# Patient Record
Sex: Female | Born: 1960 | Race: White | Hispanic: No | Marital: Married | Smoking: Never smoker
Health system: Southern US, Community
[De-identification: ages and names within clinical notes are randomized; demographics above are authoritative.]

## PROBLEM LIST (undated history)

## (undated) DIAGNOSIS — G373 Acute transverse myelitis in demyelinating disease of central nervous system: Secondary | ICD-10-CM

## (undated) DIAGNOSIS — C801 Malignant (primary) neoplasm, unspecified: Secondary | ICD-10-CM

## (undated) DIAGNOSIS — N816 Rectocele: Secondary | ICD-10-CM

## (undated) DIAGNOSIS — M1990 Unspecified osteoarthritis, unspecified site: Secondary | ICD-10-CM

## (undated) DIAGNOSIS — Z9889 Other specified postprocedural states: Secondary | ICD-10-CM

## (undated) DIAGNOSIS — R112 Nausea with vomiting, unspecified: Secondary | ICD-10-CM

## (undated) HISTORY — PX: ABDOMINAL HYSTERECTOMY: SHX81

## (undated) HISTORY — PX: TONSILLECTOMY: SUR1361

---

## 1992-04-19 HISTORY — PX: VAGINAL HYSTERECTOMY: SUR661

## 2018-06-10 ENCOUNTER — Emergency Department: Payer: BLUE CROSS/BLUE SHIELD

## 2018-06-10 ENCOUNTER — Emergency Department
Admission: EM | Admit: 2018-06-10 | Discharge: 2018-06-10 | Disposition: A | Discharge status: Home / Self Care | Payer: BLUE CROSS/BLUE SHIELD | Attending: Student in an Organized Health Care Education/Training Program | Admitting: Student in an Organized Health Care Education/Training Program

## 2018-06-10 DIAGNOSIS — M545 Low back pain: Secondary | ICD-10-CM | POA: Diagnosis present

## 2018-06-10 DIAGNOSIS — Z79899 Other long term (current) drug therapy: Secondary | ICD-10-CM | POA: Diagnosis not present

## 2018-06-10 DIAGNOSIS — Z859 Personal history of malignant neoplasm, unspecified: Secondary | ICD-10-CM | POA: Diagnosis not present

## 2018-06-10 DIAGNOSIS — R200 Anesthesia of skin: Secondary | ICD-10-CM | POA: Insufficient documentation

## 2018-06-10 DIAGNOSIS — M5442 Lumbago with sciatica, left side: Secondary | ICD-10-CM | POA: Diagnosis not present

## 2018-06-10 DIAGNOSIS — M549 Dorsalgia, unspecified: Secondary | ICD-10-CM

## 2018-06-10 HISTORY — DX: Malignant (primary) neoplasm, unspecified: C80.1

## 2018-06-10 LAB — URINALYSIS, COMPLETE (UACMP) WITH MICROSCOPIC
Bacteria, UA: NONE SEEN
Bilirubin Urine: NEGATIVE
Glucose, UA: NEGATIVE mg/dL
Hgb urine dipstick: NEGATIVE
Ketones, ur: NEGATIVE mg/dL
Leukocytes,Ua: NEGATIVE
Nitrite: NEGATIVE
Protein, ur: NEGATIVE mg/dL
Specific Gravity, Urine: 1.029 (ref 1.005–1.030)
Squamous Epithelial / HPF: NONE SEEN (ref 0–5)
pH: 7 (ref 5.0–8.0)

## 2018-06-10 LAB — CBC WITH DIFFERENTIAL/PLATELET
Abs Immature Granulocytes: 0.01 10*3/uL (ref 0.00–0.07)
Basophils Absolute: 0 10*3/uL (ref 0.0–0.1)
Basophils Relative: 1 %
EOS ABS: 0.1 10*3/uL (ref 0.0–0.5)
Eosinophils Relative: 1 %
HCT: 38.8 % (ref 36.0–46.0)
Hemoglobin: 13.2 g/dL (ref 12.0–15.0)
Immature Granulocytes: 0 %
Lymphocytes Relative: 50 %
Lymphs Abs: 1.7 10*3/uL (ref 0.7–4.0)
MCH: 30.7 pg (ref 26.0–34.0)
MCHC: 34 g/dL (ref 30.0–36.0)
MCV: 90.2 fL (ref 80.0–100.0)
Monocytes Absolute: 0.4 10*3/uL (ref 0.1–1.0)
Monocytes Relative: 11 %
Neutro Abs: 1.3 10*3/uL — ABNORMAL LOW (ref 1.7–7.7)
Neutrophils Relative %: 37 %
Platelets: 263 10*3/uL (ref 150–400)
RBC: 4.3 MIL/uL (ref 3.87–5.11)
RDW: 13.1 % (ref 11.5–15.5)
WBC: 3.5 10*3/uL — ABNORMAL LOW (ref 4.0–10.5)
nRBC: 0 % (ref 0.0–0.2)

## 2018-06-10 LAB — LIPASE, BLOOD: Lipase: 28 U/L (ref 11–51)

## 2018-06-10 LAB — COMPREHENSIVE METABOLIC PANEL
ALK PHOS: 98 U/L (ref 38–126)
ALT: 23 U/L (ref 0–44)
AST: 28 U/L (ref 15–41)
Albumin: 4 g/dL (ref 3.5–5.0)
Anion gap: 10 (ref 5–15)
BUN: 19 mg/dL (ref 6–20)
CALCIUM: 10.2 mg/dL (ref 8.9–10.3)
CO2: 22 mmol/L (ref 22–32)
Chloride: 107 mmol/L (ref 98–111)
Creatinine, Ser: 0.77 mg/dL (ref 0.44–1.00)
GFR calc Af Amer: >60 mL/min (ref >60)
GFR calc non Af Amer: >60 mL/min (ref >60)
Glucose, Bld: 88 mg/dL (ref 70–99)
Potassium: 3.6 mmol/L (ref 3.5–5.1)
Sodium: 139 mmol/L (ref 135–145)
TOTAL PROTEIN: 6.7 g/dL (ref 6.5–8.1)
Total Bilirubin: 0.5 mg/dL (ref 0.3–1.2)

## 2018-06-10 MED ORDER — ONDANSETRON HCL 4 MG/2ML IJ SOLN
4.0000 mg | Freq: Once | INTRAMUSCULAR | Status: AC
  Administered 2018-06-10: 4 mg via INTRAVENOUS

## 2018-06-10 MED ORDER — MORPHINE SULFATE (PF) 2 MG/ML IV SOLN
2.0000 mg | Freq: Once | INTRAVENOUS | Status: AC
  Administered 2018-06-10: 2 mg via INTRAVENOUS

## 2018-06-10 MED ORDER — HYDROCODONE-ACETAMINOPHEN 5-325 MG PO TABS
1.0000 | ORAL_TABLET | Freq: Once | ORAL | Status: AC
  Administered 2018-06-10: 1 via ORAL
  Filled 2018-06-10: qty 1

## 2018-06-10 MED ORDER — HYDROCODONE-ACETAMINOPHEN 5-325 MG PO TABS: 1.0000 | ORAL_TABLET | ORAL | 0 refills | Status: AC | PRN

## 2018-06-10 MED ORDER — PREDNISONE 10 MG PO TABS: 10.0000 mg | ORAL_TABLET | Freq: Every day | ORAL | 0 refills | Status: DC

## 2018-06-10 MED ORDER — HYDROCODONE-ACETAMINOPHEN 5-325 MG PO TABS: 1.0000 | ORAL_TABLET | ORAL | 0 refills | Status: DC | PRN

## 2018-06-10 MED ORDER — KETOROLAC TROMETHAMINE 30 MG/ML IJ SOLN
30.0000 mg | Freq: Once | INTRAMUSCULAR | Status: AC
  Administered 2018-06-10: 30 mg via INTRAVENOUS

## 2018-06-10 MED ORDER — PREDNISONE 20 MG PO TABS
60.0000 mg | ORAL_TABLET | Freq: Once | ORAL | Status: AC
  Administered 2018-06-10: 60 mg via ORAL
  Filled 2018-06-10: qty 3

## 2018-06-10 MED ORDER — KETOROLAC TROMETHAMINE 30 MG/ML IJ SOLN
INTRAMUSCULAR | Status: AC
  Administered 2018-06-10: 30 mg via INTRAVENOUS
  Filled 2018-06-10: qty 1

## 2018-06-10 MED ORDER — MORPHINE SULFATE (PF) 2 MG/ML IV SOLN
INTRAVENOUS | Status: AC
  Administered 2018-06-10: 2 mg via INTRAVENOUS
  Filled 2018-06-10: qty 1

## 2018-06-10 MED ORDER — LIDOCAINE 5 % EX PTCH
1.0000 | MEDICATED_PATCH | CUTANEOUS | Status: DC
  Administered 2018-06-10: 1 via TRANSDERMAL

## 2018-06-10 MED ORDER — IOPAMIDOL (ISOVUE-300) INJECTION 61%: 100.0000 mL | Freq: Once | INTRAVENOUS | Status: DC | PRN

## 2018-06-10 MED ORDER — IOHEXOL 300 MG/ML  SOLN
100.0000 mL | Freq: Once | INTRAMUSCULAR | Status: AC | PRN
  Administered 2018-06-10: 100 mL via INTRAVENOUS

## 2018-06-10 MED ORDER — ONDANSETRON HCL 4 MG/2ML IJ SOLN
INTRAMUSCULAR | Status: AC
  Administered 2018-06-10: 4 mg via INTRAVENOUS
  Filled 2018-06-10: qty 2

## 2018-06-10 NOTE — ED Notes (Signed)
Pt returned from CT.  States pain is better at this time, but left leg feels numb and she feels as if she can't lift it from the bed.  Pt voices no other needs or c/o at this time.  Awaiting Ct report, will continue to monitor.

## 2018-06-10 NOTE — ED Triage Notes (Signed)
Patient coming [facility] from for lower back pain radiating down left leg and syncopal episode (due to pain per patient. Patient reportedly got a new small trampoline 2 days ago.  EMS vitals: 128/72, 100% on RA, HR 73, pain 10 of 10.

## 2018-06-10 NOTE — ED Notes (Signed)
Pt walked with this RN using walker. At first, pt struggled some because L foot dragged behind her as though it were asleep. Pt continued to c/o numbness. Once pt got to room door (5 steps), pt gained full control/strength of L leg and stated that her numbness dec. Walker maintained pt's steadiness. Pt walked total of 25 feet with walker.

## 2018-06-10 NOTE — ED Provider Notes (Signed)
Patient received in signout from Dr. Owens Shark pending CT imaging.  Patient did have evidence of distribution and given her tingling weakness MRI was ordered to rule out more insidious pathology.  MRI does show evidence of radiculopathy.  No focal deficits.  Patient suffering from sciatica type symptoms.  Able to ambulate with walker assistance.  Physical therapy evaluated patient.  Will start on prednisone taper as well as pain medication.  Will give referral to neurosurgery but otherwise I believe the patient stable and appropriate for outpatient management.   Bethany Lot, MD 06/10/18 1258

## 2018-06-10 NOTE — ED Provider Notes (Addendum)
[facility] Emergency Department Provider Note ____________________   First MD Initiated Contact with Patient 06/10/18 [phone]     (approximate)  I have reviewed the triage vital signs and the nursing notes.   HISTORY  Chief Complaint Back Pain and Loss of Consciousness    HPI Bethany Farmer is a 58 y.o. femalepresents emergency department via EMS with 10 out of 10 low back pain with radiation down the posterior left leg with onset prior to arrival.  Patient states that she got up from bed to go to the bathroom pain began abruptly.  Patient states that she had a syncopal episode which she states was as a result of her pain.  Patient states that she has had previous syncopal episode secondary to pain.   Past Medical History:  Diagnosis Date  . Cancer ([place])     There are no active problems to display for this patient.   Past Surgical History:  Procedure Laterality Date  . ABDOMINAL HYSTERECTOMY    . TONSILLECTOMY      Prior to Admission medications   Medication Sig Start Date End Date Taking? Authorizing Provider  Abaloparatide (TYMLOS [place]) Inject into the skin.   Yes [provider]  traMADol (ULTRAM) 50 MG tablet Take 100 mg by mouth 3 (three) times daily.   Yes [provider]    Allergies Patient has no known allergies.  No family history on file.  Social History Social History   Tobacco Use  . Smoking status: Never Smoker  . Smokeless tobacco: Never Used  Substance Use Topics  . Alcohol use: Not Currently  . Drug use: Not on file    Review of Systems Constitutional: No fever/chills Eyes: No visual changes. ENT: No sore throat. Cardiovascular: Denies chest pain. Respiratory: Denies shortness of breath. Gastrointestinal: No abdominal pain.  No nausea, no vomiting.  No diarrhea.  No constipation. Genitourinary: Negative for dysuria. Musculoskeletal: Negative for neck pain.  Positive for back pain. Integumentary:  Negative for rash. Neurological: Negative for headaches, focal weakness or numbness.  ____________________________________________   PHYSICAL EXAM:  VITAL SIGNS: ED Triage Vitals  Enc Vitals Group     BP 06/10/18 0607 124/76     Pulse Rate 06/10/18 0607 78     Resp 06/10/18 0607 (!) 25     Temp 06/10/18 0607 (!) 96.4 F (35.8 C)     Temp Source 06/10/18 0607 Oral     SpO2 06/10/18 0607 100 %     Weight 06/10/18 0604 58.5 kg (129 lb)     Height 06/10/18 0604 1.676 m (5\' 6" )     Head Circumference --      Peak Flow --      Pain Score 06/10/18 0604 10     Pain Loc --      Pain Edu? --      Excl. in [place]? --     Constitutional: Alert and oriented.  Apparent discomfort  eyes: Conjunctivae are normal. PERRL. EOMI. Head: Atraumatic. Ears:  Healthy appearing ear canals and TMs bilaterally Nose: No congestion/rhinnorhea. Mouth/Throat: Mucous membranes are moist. Oropharynx non-erythematous. Neck: No stridor.   Cardiovascular: Normal rate, regular rhythm. Good peripheral circulation. Grossly normal heart sounds. Respiratory: Normal respiratory effort.  No retractions. Lungs CTAB. Gastrointestinal: Soft and nontender. No distention.  Musculoskeletal: No lower extremity tenderness nor edema. No gross deformities of extremities. Neurologic:  Normal speech and language. No gross focal neurologic deficits are appreciated.  Skin:  Skin is warm, dry  and intact. No rash noted. Psychiatric: Mood and affect are normal. Speech and behavior are normal.  ____________________________________________   LABS (all labs ordered are listed, but only abnormal results are displayed)  Labs Reviewed  CBC WITH DIFFERENTIAL/PLATELET - Abnormal; Notable for the following components:      Result Value   WBC 3.5 (*)    Neutro Abs 1.3 (*)    All other components within normal limits  COMPREHENSIVE METABOLIC PANEL  LIPASE, BLOOD  URINALYSIS, COMPLETE (UACMP) WITH MICROSCOPIC   ED ECG REPORT I,  Waipio N Draven Laine, the attending physician, personally viewed and interpreted this ECG.   Date: 06/10/2018  EKG Time: 6:12 AM  Rate: 75  Rhythm: Normal sinus rhythm  Axis: Normal  Intervals: Normal  ST&T Change: None     Procedures   ____________________________________________   INITIAL IMPRESSION / ASSESSMENT AND PLAN / ED COURSE  As part of my medical decision making, I reviewed the following data within the electronic MEDICAL RECORD NUMBER   58 year old female presented with above-stated history and physical exam with differential diagnosis including sciatica, aortic injury.  As such CT scan of the abdomen pelvis with reconstruction of the lumbar spine ordered.  Patient given IV morphine 2 mg and Zofran 4 mg immediately after my evaluation with improvement of pain.  Patient's current pain score 6 out of 10 at such patient given Toradol 30 mg.  CT scan pending at this time anticipate possible discharge when pain control is achieved if patient scan consistent with sciatica.  Patient's care transferred to Dr. Quentin Cornwall    ____________________________________________  FINAL CLINICAL IMPRESSION(S) / ED DIAGNOSES  Final diagnoses:  Back pain     MEDICATIONS GIVEN DURING THIS VISIT:  Medications  lidocaine (LIDODERM) 5 % 1 patch (1 patch Transdermal Patch Applied 06/10/18 0617)  morphine 2 MG/ML injection 2 mg (2 mg Intravenous Given 06/10/18 0617)  ondansetron (ZOFRAN) injection 4 mg (4 mg Intravenous Given 06/10/18 0617)  ketorolac (TORADOL) 30 MG/ML injection 30 mg (30 mg Intravenous Given 06/10/18 0644)  iohexol (OMNIPAQUE) 300 MG/ML solution 100 mL (100 mLs Intravenous Contrast Given 06/10/18 2694)     ED Discharge Orders    None       Note:  This document was prepared using Dragon voice recognition software and may include unintentional dictation errors.   Gregor Hams, MD 06/10/18 8546    Gregor Hams, MD 06/24/18 [phone]

## 2018-06-10 NOTE — Evaluation (Signed)
Physical Therapy Evaluation Patient Details Name: Paulita Licklider MRN: [id number] DOB: [birth year] Today's Date: 06/10/2018   History of Present Illness  Pt is a 58 year old female who comes to the ED with report of L side LBP and numbness of L LE.  Only noted hx is CA.  Clinical Impression  Pt is a 58 year old female who lives in a one story home with her husband and is a Hydrographic surveyor at baseline.  Pt in bed and reporting low back pain but able to perform bed mobility mod I.  Pt + for posterolateral trunk extension directional preference as well as supine figure 4 stretch.  She presented with fair to good strength of UE/LE.  PT open to all education.  Pt able to STS and ambulate with min VC's for use of RW.  Pt will benefit from skilled PT with focus on pain management, HEP and safe functional mobility.  She is appropriate for OP PT at this time.    Follow Up Recommendations Outpatient PT    Equipment Recommendations  Rolling walker with 5" wheels    Recommendations for Other Services       Precautions / Restrictions Restrictions Weight Bearing Restrictions: No      Mobility  Bed Mobility Overal bed mobility: Modified Independent             General bed mobility comments: slow to move  Transfers Overall transfer level: Modified independent Equipment used: Rolling walker (2 wheeled)             General transfer comment: Pt able to stand from bedside without UE assist but more steady with RW.  Ambulation/Gait Ambulation/Gait assistance: Supervision Gait Distance (Feet): 25 Feet Assistive device: Standard walker     Gait velocity interpretation: 1.31 - 2.62 ft/sec, indicative of limited community ambulator General Gait Details: Fair foot clearance with slightly flexed posture due to pain.  Stairs            Wheelchair Mobility    Modified Rankin (Stroke Patients Only)       Balance Overall balance assessment: Needs assistance   Sitting  balance-Leahy Scale: Normal     Standing balance support: Bilateral upper extremity supported Standing balance-Leahy Scale: Fair Standing balance comment: Needs RW for support                             Pertinent Vitals/Pain Pain Assessment: 0-10 Faces Pain Scale: Hurts a little bit Pain Location: L low back Pain Descriptors / Indicators: Aching Pain Intervention(s): Monitored during session    Home Living Family/patient expects to be discharged to:: Private residence Living Arrangements: Spouse/significant other Available Help at Discharge: Family;Available PRN/intermittently Type of Home: House Home Access: Stairs to enter Entrance Stairs-Rails: Can reach both Entrance Stairs-Number of Steps: 3 Home Layout: One level Home Equipment: None      Prior Function Level of Independence: Independent         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     Hand Dominance        Extremity/Trunk Assessment   Upper Extremity Assessment Upper Extremity Assessment: Overall WFL for tasks assessed    Lower Extremity Assessment Lower Extremity Assessment: Overall WFL for tasks assessed    Cervical / Trunk Assessment Cervical / Trunk Assessment: Normal  Communication   Communication: No difficulties  Cognition Arousal/Alertness: Awake/alert Behavior During Therapy: WFL for tasks assessed/performed Overall Cognitive Status: Within Functional  Limits for tasks assessed                                        General Comments      Exercises Other Exercises Other Exercises: Assessment for directional preference with pt favoring trunk extension: x10 with decrease in LBP but not in L LE numbness. Other Exercises: Education regarding HEP management until pt can get started with OP PT. Other Exercises: Figure 4 testing: + for L hip pain but no change in L LE numbness. Other Exercises: Education for use of RW and adjustment of RW  to pt height.   Assessment/Plan    PT Assessment Patient needs continued PT services  PT Problem List Decreased strength;Decreased balance;Decreased knowledge of use of DME;Decreased activity tolerance;Pain       PT Treatment Interventions DME instruction;Therapeutic exercise;Gait training;Stair training;Balance training;Functional mobility training;Therapeutic activities;Patient/family education;Neuromuscular re-education    PT Goals (Current goals can be found in the Care Plan section)  Acute Rehab PT Goals Patient Stated Goal: to return to work and to be able to move pain free PT Goal Formulation: With patient Time For Goal Achievement: 06/24/18 Potential to Achieve Goals: Good    Frequency Min 2X/week   Barriers to discharge        Co-evaluation               AM-PAC PT "6 Clicks" Mobility  Outcome Measure Help needed turning from your back to your side while in a flat bed without using bedrails?: None Help needed moving from lying on your back to sitting on the side of a flat bed without using bedrails?: A Little Help needed moving to and from a bed to a chair (including a wheelchair)?: A Little Help needed standing up from a chair using your arms (e.g., wheelchair or bedside chair)?: A Little Help needed to walk in hospital room?: A Little Help needed climbing 3-5 steps with a railing? : A Little 6 Click Score: 19    End of Session   Activity Tolerance: Patient tolerated treatment well Patient left: in bed;with family/visitor present Nurse Communication: Mobility status PT Visit Diagnosis: Other abnormalities of gait and mobility (R26.89);Muscle weakness (generalized) (M62.81);Pain Pain - Right/Left: (Back)    Time: 1937-9024 PT Time Calculation (min) (ACUTE ONLY): 22 min   Charges:   PT Evaluation $PT Eval Low Complexity: 1 Low PT Treatments $Therapeutic Activity: 8-22 mins        Roxanne Gates, PT, DPT   Roxanne Gates 06/10/2018, 12:55 PM

## 2018-06-10 NOTE — ED Notes (Signed)
Patient transported to MRI 

## 2018-06-10 NOTE — Discharge Instructions (Addendum)

## 2018-06-10 NOTE — ED Notes (Signed)
Pt's L dorsalis pedis pulse 2+. Foot appropriate color and warmth.

## 2018-06-14 DIAGNOSIS — R200 Anesthesia of skin: Secondary | ICD-10-CM | POA: Insufficient documentation

## 2018-06-14 DIAGNOSIS — M810 Age-related osteoporosis without current pathological fracture: Secondary | ICD-10-CM | POA: Insufficient documentation

## 2019-06-21 ENCOUNTER — Encounter: Payer: Self-pay | Admitting: Family Medicine

## 2019-07-11 ENCOUNTER — Other Ambulatory Visit: Payer: Self-pay | Admitting: Neurology

## 2019-07-11 DIAGNOSIS — G373 Acute transverse myelitis in demyelinating disease of central nervous system: Secondary | ICD-10-CM

## 2019-07-25 ENCOUNTER — Ambulatory Visit: Payer: BC Managed Care – PPO

## 2019-08-01 ENCOUNTER — Other Ambulatory Visit: Payer: Self-pay

## 2019-08-01 ENCOUNTER — Encounter: Payer: Self-pay | Admitting: Gastroenterology

## 2019-08-01 ENCOUNTER — Ambulatory Visit (INDEPENDENT_AMBULATORY_CARE_PROVIDER_SITE_OTHER): Payer: BC Managed Care – PPO | Admitting: Gastroenterology

## 2019-08-01 VITALS — BP 120/70 | HR 69 | Temp 97.5°F | Ht 66.0 in | Wt 144.6 lb

## 2019-08-01 DIAGNOSIS — R748 Abnormal levels of other serum enzymes: Secondary | ICD-10-CM | POA: Diagnosis not present

## 2019-08-01 DIAGNOSIS — Z86010 Personal history of colonic polyps: Secondary | ICD-10-CM

## 2019-08-01 NOTE — Progress Notes (Signed)
Gastroenterology Consultation  Referring Provider:     Nadean Corwin, MD Primary Care Physician:  Darius Bump, FNP Primary Gastroenterologist:  Dr. Allen Norris     Reason for Consultation:     Abnormal liver enzymes        HPI:   Bethany Farmer is a 59 y.o. y/o female referred for consultation & management of abnormal liver enzymes by Dr. Rock Nephew, Bonnell Public, FNP.  This patient comes in today with a referral sent for evaluation of abnormal liver enzymes with a normal ultrasound.  The patient's most recent labs from my chart show her liver enzymes on February 25 of last year to be normal:  AST (Aspartate Aminotransferase) 23 15 - 41 U/L  ALT (Alanine Aminotransferase) 23 14 - 54 U/L  Bilirubin, Total 0.5 0.4 - 1.5 mg/dL  Alk Phos (Alkaline Phosphatase) 100 24 - 110 U/L   The patient had an ultrasound that was done on February 18 of this year that did not show any abnormalities.  She had labs done ore recently but they were not sent over from her previous care provider. The patient had the labs on2/11/2019 her phone and showed AST 26, ALT of 22 and Alk phos of 125 with normal bilirubin.   The patient had a colonoscopy with adenomatous polyps in 2014 and says she is due for another one.  She has only one reading of abnormal liver enzymes with the isolated alkaline phosphatase increase.  The patient also reports that she is having some abdominal bloating and gas.  Past Medical History:  Diagnosis Date  . Cancer [facility])     Past Surgical History:  Procedure Laterality Date  . ABDOMINAL HYSTERECTOMY    . TONSILLECTOMY      Prior to Admission medications   Medication Sig Start Date End Date Taking? Authorizing Provider  Abaloparatide (TYMLOS [place]) Inject into the skin.    [provider]  aspirin 81 MG EC tablet Take by mouth.    [provider]  baclofen (LIORESAL) 10 MG tablet TAKE 1 2 (ONE HALF) TABLET BY MOUTH THREE TIMES DAILY 07/04/19   [provider]   DULoxetine (CYMBALTA) 20 MG capsule Take 20 mg by mouth 2 (two) times daily. 02/15/19   [provider]  gabapentin (NEURONTIN) 600 MG tablet Take 600 mg by mouth 3 (three) times daily. 07/15/19   [provider]  predniSONE (DELTASONE) 10 MG tablet Take 1 tablet (10 mg total) by mouth daily. Day 1-2: Take 50 mg  Day 3-4  Take 40 mg Day 5-6:Take 30 mg Day 7-8: Take 20 mg Day 9: Take 72m 06/10/18   RMerlyn Lot MD  traMADol (ULTRAM) 50 MG tablet Take 100 mg by mouth 3 (three) times daily.    [provider]  valACYclovir (VALTREX) 500 MG tablet Take 500 mg by mouth 2 (two) times daily. 07/16/19   [provider]    No family history on file.   Social History   Tobacco Use  . Smoking status: Never Smoker  . Smokeless tobacco: Never Used  Substance Use Topics  . Alcohol use: Not Currently  . Drug use: Not on file    Allergies as of 08/01/2019  . (No Known Allergies)    Review of Systems:    All systems reviewed and negative except where noted in HPI.   Physical Exam:  There were no vitals taken for this visit. No LMP recorded. Patient has had a hysterectomy. General:  Alert,  Well-developed, well-nourished, pleasant and cooperative in NAD Head:  Normocephalic and atraumatic. Eyes:  Sclera clear, no icterus.   Conjunctiva pink. Ears:  Normal auditory acuity. Neck:  Supple; no masses or thyromegaly. Lungs:  Respirations even and unlabored.  Clear throughout to auscultation.   No wheezes, crackles, or rhonchi. No acute distress. Heart:  Regular rate and rhythm; no murmurs, clicks, rubs, or gallops. Abdomen:  Normal bowel sounds.  No bruits.  Soft, non-tender and non-distended without masses, hepatosplenomegaly or hernias noted.  No guarding or rebound tenderness.  Negative Carnett sign.   Rectal:  Deferred.  Pulses:  Normal pulses noted. Extremities:  No clubbing or edema.  No cyanosis. Neurologic:  Alert and oriented x3;  grossly normal  neurologically. Skin:  Intact without significant lesions or rashes.  No jaundice. Lymph Nodes:  No significant cervical adenopathy. Psych:  Alert and cooperative. Normal mood and affect.  Imaging Studies: No results found.  Assessment and Plan:   Bethany Farmer is a 59 y.o. y/o female who comes in today with a history of adenomatous polyps and in need of a repeat colonoscopy.  The patient's last colonoscopy was in 2014.  The patient alkaline phosphatase was elevated at 125 with the upper limit of normal being 110.  This was a single reading and the patient will have her alkaline phosphatase fractionated and she will also have a GGT sent off.  The algorithm for her work-up is below.  The patient has been reassured that her other liver enzymes are normal and the elevation in GGT may be from a multitude of different causes including medications and may not even be from a liver source at all.  The patient has been explained the plan and agrees with it and she will be contacted with the results of her test.      Lucilla Lame, MD. Marval Regal    Note: This dictation was prepared with Dragon dictation along with smaller phrase technology. Any transcriptional errors that result from this process are unintentional.

## 2019-08-06 ENCOUNTER — Ambulatory Visit: Payer: BC Managed Care – PPO

## 2019-08-06 LAB — ALKALINE PHOSPHATASE, ISOENZYMES
Alkaline Phosphatase: 121 IU/L — ABNORMAL HIGH (ref 39–117)
BONE FRACTION: 16 % (ref 14–68)
INTESTINAL FRAC.: 6 % (ref 0–18)
LIVER FRACTION: 78 % (ref 18–85)

## 2019-08-06 LAB — GAMMA GT: GGT: 32 IU/L (ref 0–60)

## 2019-08-08 ENCOUNTER — Telehealth: Payer: Self-pay

## 2019-08-08 NOTE — Telephone Encounter (Signed)
Pt notified of results

## 2019-08-08 NOTE — Telephone Encounter (Signed)
-----   Message from Lucilla Lame, MD sent at 08/07/2019  7:55 AM EDT ----- Let the patient know that her increased alkaline phosphatase was primarily from the liver but is only minimally elevated and the recommendations are to repeat it in 6 months.

## 2019-08-15 ENCOUNTER — Ambulatory Visit: Payer: BC Managed Care – PPO

## 2019-08-24 ENCOUNTER — Encounter: Payer: Self-pay | Admitting: Gastroenterology

## 2019-08-30 ENCOUNTER — Other Ambulatory Visit
Admission: RE | Admit: 2019-08-30 | Discharge: 2019-08-30 | Disposition: A | Discharge status: Home / Self Care | Payer: BC Managed Care – PPO | Source: Ambulatory Visit | Attending: Gastroenterology | Admitting: Gastroenterology

## 2019-08-30 ENCOUNTER — Other Ambulatory Visit: Payer: Self-pay

## 2019-08-30 DIAGNOSIS — Z20822 Contact with and (suspected) exposure to covid-19: Secondary | ICD-10-CM | POA: Diagnosis not present

## 2019-08-30 DIAGNOSIS — Z01812 Encounter for preprocedural laboratory examination: Secondary | ICD-10-CM | POA: Diagnosis present

## 2019-08-30 LAB — SARS CORONAVIRUS 2 (TAT 6-24 HRS): SARS Coronavirus 2: NEGATIVE

## 2019-09-03 ENCOUNTER — Ambulatory Visit: Payer: BC Managed Care – PPO | Admitting: Anesthesiology

## 2019-09-03 ENCOUNTER — Encounter
Admission: RE | Disposition: A | Discharge status: Home / Self Care | Payer: Self-pay | Source: Home / Self Care | Attending: Gastroenterology

## 2019-09-03 ENCOUNTER — Other Ambulatory Visit: Payer: Self-pay

## 2019-09-03 ENCOUNTER — Ambulatory Visit
Admission: RE | Admit: 2019-09-03 | Discharge: 2019-09-03 | Disposition: A | Discharge status: Home / Self Care | Payer: BC Managed Care – PPO | Attending: Gastroenterology | Admitting: Gastroenterology

## 2019-09-03 ENCOUNTER — Encounter: Payer: Self-pay | Admitting: Gastroenterology

## 2019-09-03 DIAGNOSIS — K640 First degree hemorrhoids: Secondary | ICD-10-CM | POA: Insufficient documentation

## 2019-09-03 DIAGNOSIS — K635 Polyp of colon: Secondary | ICD-10-CM | POA: Diagnosis not present

## 2019-09-03 DIAGNOSIS — Z860100 Personal history of colon polyps, unspecified: Secondary | ICD-10-CM

## 2019-09-03 DIAGNOSIS — Z7982 Long term (current) use of aspirin: Secondary | ICD-10-CM | POA: Insufficient documentation

## 2019-09-03 DIAGNOSIS — D123 Benign neoplasm of transverse colon: Secondary | ICD-10-CM | POA: Diagnosis not present

## 2019-09-03 DIAGNOSIS — M1990 Unspecified osteoarthritis, unspecified site: Secondary | ICD-10-CM | POA: Diagnosis not present

## 2019-09-03 DIAGNOSIS — Z79899 Other long term (current) drug therapy: Secondary | ICD-10-CM | POA: Insufficient documentation

## 2019-09-03 DIAGNOSIS — K5730 Diverticulosis of large intestine without perforation or abscess without bleeding: Secondary | ICD-10-CM | POA: Diagnosis not present

## 2019-09-03 DIAGNOSIS — Z1211 Encounter for screening for malignant neoplasm of colon: Secondary | ICD-10-CM | POA: Diagnosis not present

## 2019-09-03 HISTORY — PX: POLYPECTOMY: SHX5525

## 2019-09-03 HISTORY — DX: Unspecified osteoarthritis, unspecified site: M19.90

## 2019-09-03 HISTORY — DX: Acute transverse myelitis in demyelinating disease of central nervous system: G37.3

## 2019-09-03 HISTORY — PX: COLONOSCOPY WITH PROPOFOL: SHX5780

## 2019-09-03 SURGERY — COLONOSCOPY WITH PROPOFOL
Anesthesia: General | Site: Rectum

## 2019-09-03 MED ORDER — LACTATED RINGERS IV SOLN: INTRAVENOUS | Status: DC

## 2019-09-03 MED ORDER — STERILE WATER FOR IRRIGATION IR SOLN
Status: DC | PRN
  Administered 2019-09-03: 50 mL

## 2019-09-03 MED ORDER — LIDOCAINE HCL (CARDIAC) PF 100 MG/5ML IV SOSY
PREFILLED_SYRINGE | INTRAVENOUS | Status: DC | PRN
  Administered 2019-09-03: 30 mg via INTRAVENOUS

## 2019-09-03 MED ORDER — PROPOFOL 10 MG/ML IV BOLUS
INTRAVENOUS | Status: DC | PRN
  Administered 2019-09-03: 150 mg via INTRAVENOUS
  Administered 2019-09-03: 30 mg via INTRAVENOUS
  Administered 2019-09-03: 40 mg via INTRAVENOUS
  Administered 2019-09-03 (×2): 30 mg via INTRAVENOUS

## 2019-09-03 SURGICAL SUPPLY — 6 items
FORCEPS BIOP RAD 4 LRG CAP 4 (CUTTING FORCEPS) ×3 IMPLANT
GOWN CVR UNV OPN BCK APRN NK (MISCELLANEOUS) ×2 IMPLANT
GOWN ISOL THUMB LOOP REG UNIV (MISCELLANEOUS) ×4
KIT ENDO PROCEDURE OLY (KITS) ×3 IMPLANT
MANIFOLD NEPTUNE II (INSTRUMENTS) ×3 IMPLANT
WATER STERILE IRR 250ML POUR (IV SOLUTION) ×3 IMPLANT

## 2019-09-03 NOTE — H&P (Signed)
Bethany Lame, MD [facility] [address], [place], [place] [address] [phone] Fax : [phone]  Primary Care Physician:  Bethany Bump, FNP Primary Gastroenterologist:  Dr. Allen Farmer  Pre-Procedure History & Physical: HPI:  Bethany Farmer is a 59 y.o. female is here for an colonoscopy.   Past Medical History:  Diagnosis Date  . Arthritis    hands  . Cancer ([place])   . 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 ABDOMINAL HYSTERECTOMY    . TONSILLECTOMY      Prior to Admission medications   Medication Sig Start Date End Date Taking? Authorizing Provider  calcium carbonate (OS-CAL) 1250 (500 Ca) MG chewable tablet Chew 1 tablet by mouth daily.   Yes [provider]  cholecalciferol (VITAMIN D3) 25 MCG (1000 UNIT) tablet Take 1,000 Units by mouth daily.   Yes [provider]  denosumab (PROLIA) 60 MG/ML SOSY injection Inject 60 mg into the skin every 6 (six) months.   Yes [provider]  Javier Docker Oil 1000 MG CAPS Take by mouth daily.   Yes [provider]  Red Yeast Rice 600 MG CAPS Take by mouth daily.   Yes [provider]  Abaloparatide (TYMLOS [place]) Inject into the skin.    [provider]  aspirin 81 MG EC tablet Take by mouth.    [provider]  baclofen (LIORESAL) 10 MG tablet TAKE 1 2 (ONE HALF) TABLET BY MOUTH THREE TIMES DAILY 07/04/19   [provider]  DULoxetine (CYMBALTA) 20 MG capsule Take 20 mg by mouth 2 (two) times daily. 02/15/19   [provider]  gabapentin (NEURONTIN) 600 MG tablet Take 600 mg by mouth 3 (three) times daily. 07/15/19   [provider]  predniSONE (DELTASONE) 10 MG tablet Take 1 tablet (10 mg total) by mouth daily. Day 1-2: Take 50 mg  Day 3-4  Take 40 mg Day 5-6:Take 30 mg Day 7-8: Take 20 mg Day 9: Take 10mg  Patient not taking: Reported on 08/01/2019 06/10/18   Bethany Lot, MD   traMADol (ULTRAM) 50 MG tablet Take 100 mg by mouth 3 (three) times daily.    [provider]  valACYclovir (VALTREX) 500 MG tablet Take 500 mg by mouth 2 (two) times daily. 07/16/19   [provider]    Allergies as of 08/01/2019  . (No Known Allergies)    History reviewed. No pertinent family history.  Social History   Socioeconomic History  . Marital status: Married    Spouse name: Not on file  . Number of children: Not on file  . Years of education: Not on file  . Highest education level: Not on file  Occupational History  . Not on file  Tobacco Use  . Smoking status: Never Smoker  . Smokeless tobacco: Never Used  Substance and Sexual Activity  . Alcohol use: Not Currently  . Drug use: Not on file  . Sexual activity: Not on file  Other Topics Concern  . Not on file  Social History Narrative  . Not on file   Social Determinants of Health   Financial Resource Strain:   . Difficulty of Paying Living Expenses:   Food Insecurity:   . Worried About Charity fundraiser in the Last Year:   . Arboriculturist in the Last Year:   Transportation Needs:   . Film/video editor (Medical):   Marland Kitchen  Lack of Transportation (Non-Medical):   Physical Activity:   . Days of Exercise per Week:   . Minutes of Exercise per Session:   Stress:   . Feeling of Stress :   Social Connections:   . Frequency of Communication with Friends and Family:   . Frequency of Social Gatherings with Friends and Family:   . Attends Religious Services:   . Active Member of Clubs or Organizations:   . Attends Archivist Meetings:   Marland Kitchen Marital Status:   Intimate Partner Violence:   . Fear of Current or Ex-Partner:   . Emotionally Abused:   Marland Kitchen Physically Abused:   . Sexually Abused:     Review of Systems: See HPI, otherwise negative ROS  Physical Exam: Ht 5\' 6"  (1.676 m)   Wt 62.6 kg   BMI 22.27 kg/m  General:   Alert,  pleasant and cooperative in NAD Head:   Normocephalic and atraumatic. Neck:  Supple; no masses or thyromegaly. Lungs:  Clear throughout to auscultation.    Heart:  Regular rate and rhythm. Abdomen:  Soft, nontender and nondistended. Normal bowel sounds, without guarding, and without rebound.   Neurologic:  Alert and  oriented x4;  grossly normal neurologically.  Impression/Plan: Bethany Farmer is here for an colonoscopy to be performed for history of adenomatous colon polyps in 2015  Risks, benefits, limitations, and alternatives regarding  colonoscopy have been reviewed with the patient.  Questions have been answered.  All parties agreeable.   Bethany Lame, MD  09/03/2019, 10:43 AM

## 2019-09-03 NOTE — Anesthesia Preprocedure Evaluation (Signed)
Anesthesia Evaluation  Patient identified by MRN, date of birth, ID band Patient awake    Reviewed: Allergy & Precautions, NPO status , Patient's Chart, lab work & pertinent test results  Airway Mallampati: II  TM Distance: >3 FB Neck ROM: Full    Dental no notable dental hx.    Pulmonary neg pulmonary ROS,    Pulmonary exam normal        Cardiovascular negative cardio ROS Normal cardiovascular exam     Neuro/Psych Pain/weakness left leg negative psych ROS   GI/Hepatic Neg liver ROS, colon polyps    Endo/Other  negative endocrine ROS  Renal/GU negative Renal ROS     Musculoskeletal  (+) Arthritis ,   Abdominal Normal abdominal exam  (+)   Peds  Hematology negative hematology ROS (+)   Anesthesia Other Findings   Reproductive/Obstetrics negative OB ROS                             Anesthesia Physical Anesthesia Plan  ASA: II  Anesthesia Plan: General   Post-op Pain Management:    Induction: Intravenous  PONV Risk Score and Plan: 3 and Propofol infusion, TIVA and Treatment may vary due to age or medical condition  Airway Management Planned: Natural Airway and Nasal Cannula  Additional Equipment: None  Intra-op Plan:   Post-operative Plan:   Informed Consent: I have reviewed the patients History and Physical, chart, labs and discussed the procedure including the risks, benefits and alternatives for the proposed anesthesia with the patient or authorized representative who has indicated his/her understanding and acceptance.       Plan Discussed with: CRNA  Anesthesia Plan Comments:         Anesthesia Quick Evaluation

## 2019-09-03 NOTE — Transfer of Care (Signed)
Immediate Anesthesia Transfer of Care Note  Patient: Bethany Farmer  Procedure(s) Performed: COLONOSCOPY WITH PROPOFOL (N/A Rectum) POLYPECTOMY (N/A Rectum)  Patient Location: PACU  Anesthesia Type: General  Level of Consciousness: awake, alert  and patient cooperative  Airway and Oxygen Therapy: Patient Spontanous Breathing and Patient connected to supplemental oxygen  Post-op Assessment: Post-op Vital signs reviewed, Patient's Cardiovascular Status Stable, Respiratory Function Stable, Patent Airway and No signs of Nausea or vomiting  Post-op Vital Signs: Reviewed and stable  Complications: No apparent anesthesia complications

## 2019-09-03 NOTE — Anesthesia Procedure Notes (Signed)
Date/Time: 09/03/2019 11:05 AM Performed by: Cameron Ali, CRNA Pre-anesthesia Checklist: Patient identified, Emergency Drugs available, Suction available, Timeout performed and Patient being monitored Patient Re-evaluated:Patient Re-evaluated prior to induction Oxygen Delivery Method: Nasal cannula Placement Confirmation: positive ETCO2

## 2019-09-03 NOTE — Op Note (Signed)
[facility] Patient Name: Bethany Farmer Procedure Date: 09/03/2019 10:59 AM MRN: [id number] Account #: [id number] Date of Birth: 19-Jan-1961 Admit Type: Outpatient Age: 59 Room: [facility] OR ROOM 01 Gender: Female Note Status: Finalized Procedure:             Colonoscopy Indications:           High risk colon cancer surveillance: Personal history                         of colonic polyps Providers:             Lucilla Lame MD, MD Medicines:             Propofol per Anesthesia Complications:         No immediate complications. Procedure:             Pre-Anesthesia Assessment:                        - Prior to the procedure, a History and Physical was                         performed, and patient medications and allergies were                         reviewed. The patient's tolerance of previous                         anesthesia was also reviewed. The risks and benefits                         of the procedure and the sedation options and risks                         were discussed with the patient. All questions were                         answered, and informed consent was obtained. Prior                         Anticoagulants: The patient has taken no previous                         anticoagulant or antiplatelet agents. ASA Grade                         Assessment: II - A patient with mild systemic disease.                         After reviewing the risks and benefits, the patient                         was deemed in satisfactory condition to undergo the                         procedure.                        After obtaining informed consent, the colonoscope was  passed under direct vision. Throughout the procedure,                         the patient's blood pressure, pulse, and oxygen                         saturations were monitored continuously. The was                         introduced through the anus and advanced  to the the                         cecum, identified by appendiceal orifice and ileocecal                         valve. The colonoscopy was performed without                         difficulty. The patient tolerated the procedure well.                         The quality of the bowel preparation was excellent. Findings:      The perianal and digital rectal examinations were normal.      Multiple small-mouthed diverticula were found in the sigmoid colon.      Non-bleeding internal hemorrhoids were found during retroflexion. The       hemorrhoids were Grade I (internal hemorrhoids that do not prolapse).      A 4 mm polyp was found in the transverse colon. The polyp was sessile.       The polyp was removed with a cold biopsy forceps. Resection and       retrieval were complete. Impression:            - Diverticulosis in the sigmoid colon.                        - Non-bleeding internal hemorrhoids.                        - One 4 mm polyp in the transverse colon, removed with                         a cold biopsy forceps. Resected and retrieved. Recommendation:        - Discharge patient to home.                        - Resume previous diet.                        - Continue present medications.                        - Await pathology results.                        - Repeat colonoscopy in 5 years for surveillance. Procedure Code(s):     --- Professional ---                        [phone], Colonoscopy, flexible; with biopsy,  single or                         multiple Diagnosis Code(s):     --- Professional ---                        Z86.010, Personal history of colonic polyps                        K63.5, Polyp of colon CPT copyright 2019 American [employer] Association. All rights reserved. The codes documented in this report are preliminary and upon coder review may  be revised to meet current compliance requirements. Lucilla Lame MD, MD 09/03/2019 11:27:39 AM This report has been signed  electronically. Number of Addenda: 0 Note Initiated On: 09/03/2019 10:59 AM Scope Withdrawal Time: 0 hours 8 minutes 24 seconds  Total Procedure Duration: 0 hours 14 minutes 37 seconds  Estimated Blood Loss:  Estimated blood loss: none.      [facility]

## 2019-09-03 NOTE — Anesthesia Postprocedure Evaluation (Signed)
Anesthesia Post Note  Patient: Bethany Farmer  Procedure(s) Performed: COLONOSCOPY WITH PROPOFOL (N/A Rectum) POLYPECTOMY (N/A Rectum)     Anesthesia Post Evaluation  Ardeth Sportsman

## 2019-09-04 ENCOUNTER — Encounter: Payer: Self-pay | Admitting: *Deleted

## 2019-09-04 LAB — SURGICAL PATHOLOGY

## 2019-09-05 ENCOUNTER — Encounter: Payer: Self-pay | Admitting: Gastroenterology

## 2019-11-06 IMAGING — CT CT ABD-PELV W/ CM
2 of 5 series · 16 of 46 positions shown, 18 images · IV contrast (omnipaque)
Comparison: Lumbar spine CT of same date, dictated separately.

CLINICAL DATA: Left abdominal/back pain radiating into left leg.
Syncopal episode.

EXAM:
CT ABDOMEN AND PELVIS WITH CONTRAST
TECHNIQUE: Multidetector CT imaging of the abdomen and pelvis was performed
using the standard protocol following bolus administration of
intravenous contrast.
CONTRAST:  100mL OMNIPAQUE IOHEXOL 300 MG/ML  SOLN

[Series 2: routine abd/pel with · axial · 0.68mm/px · z∈[-1066,-691]mm · 13 of 85 slices shown, 15 images]
[im 5/85  soft-tissue]
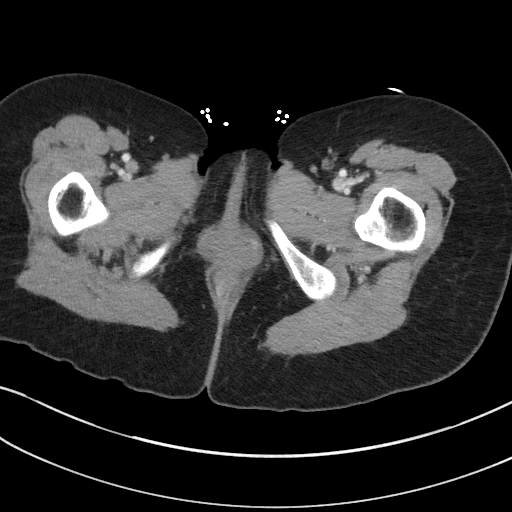
[im 5/85  bone]
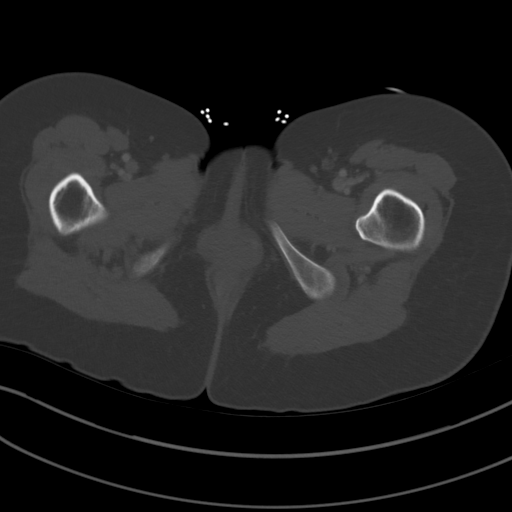
[im 10/85  soft-tissue]
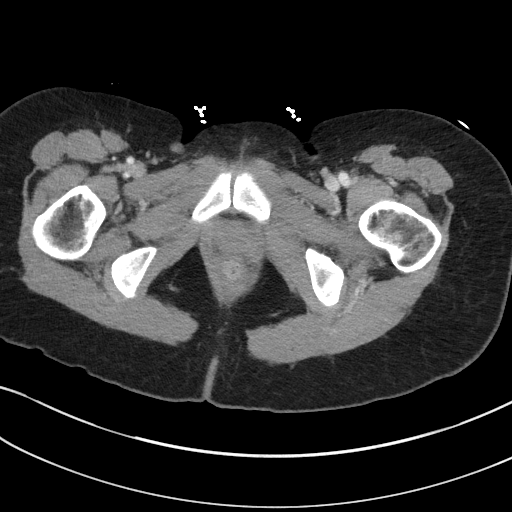
[im 19/85  soft-tissue]
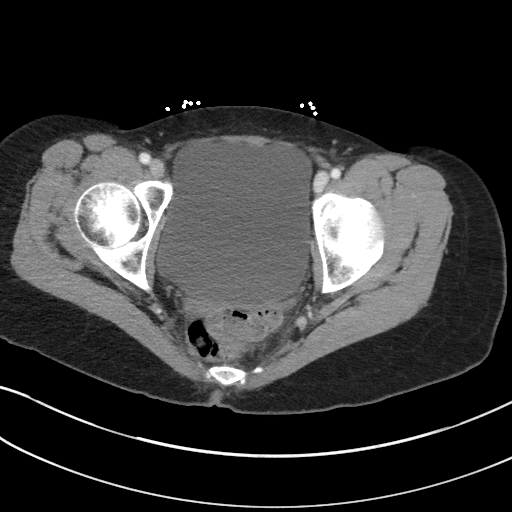
[im 24/85  soft-tissue]
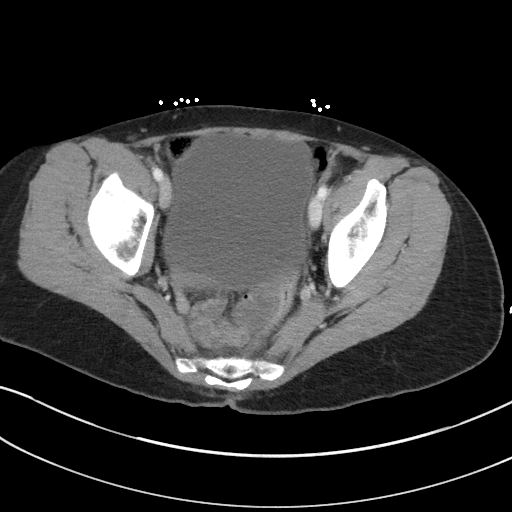
[im 29/85  soft-tissue]
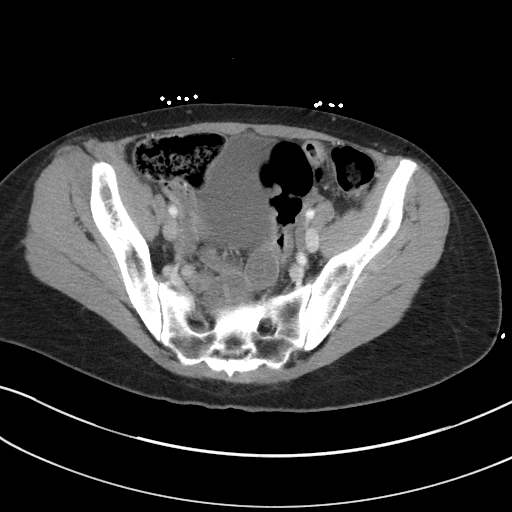
[im 38/85  soft-tissue]
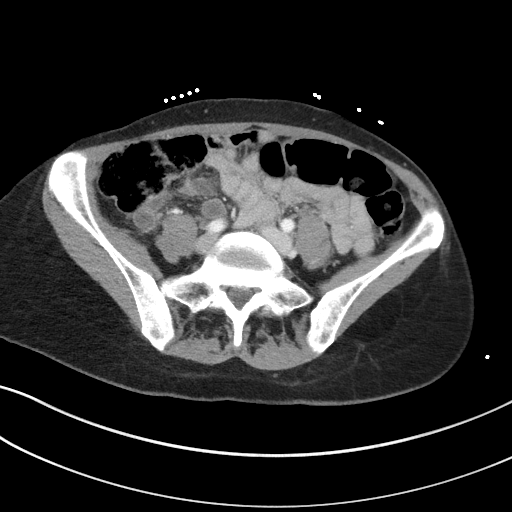
[im 43/85  soft-tissue]
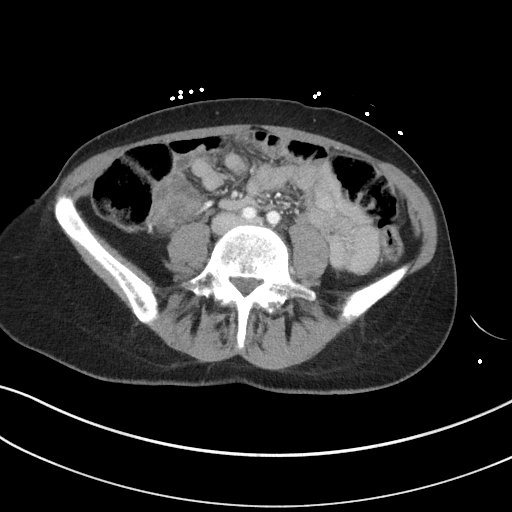
[im 47/85  soft-tissue]
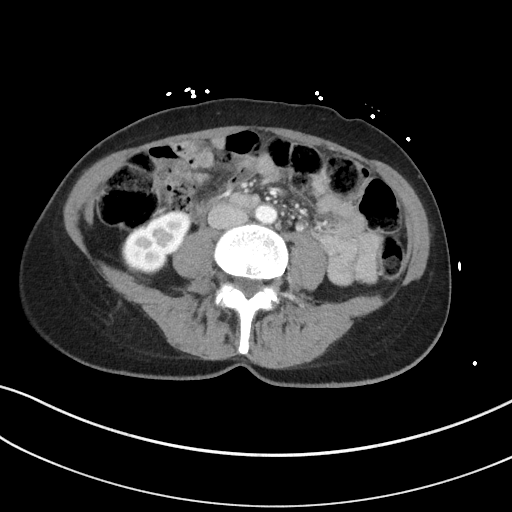
[im 57/85  soft-tissue]
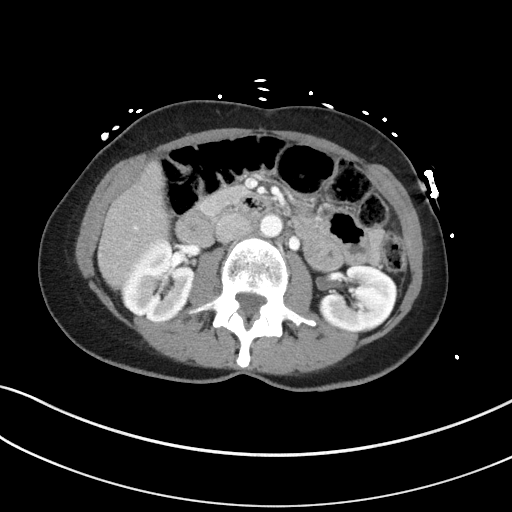
[im 57/85  bone]
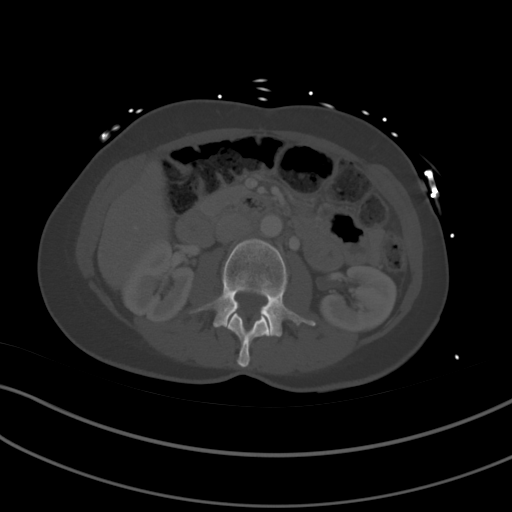
[im 61/85  soft-tissue]
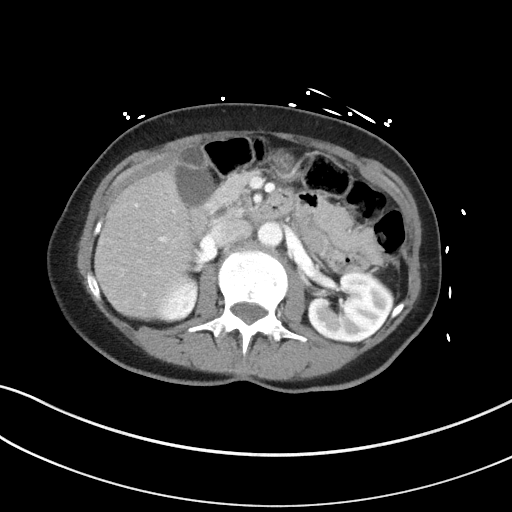
[im 66/85  soft-tissue]
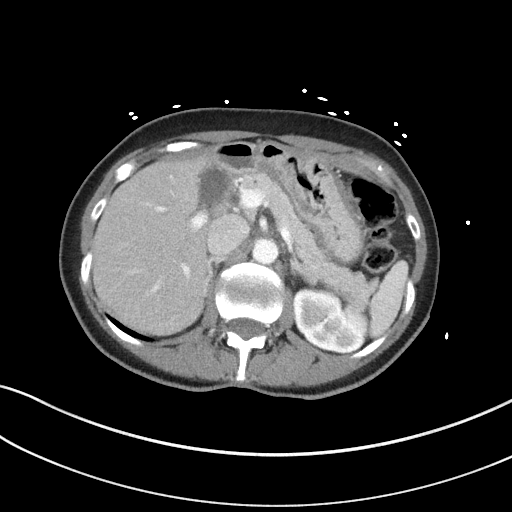
[im 75/85  soft-tissue]
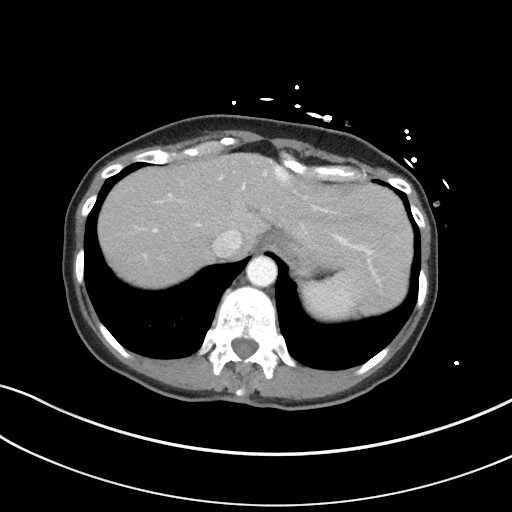
[im 80/85  soft-tissue]
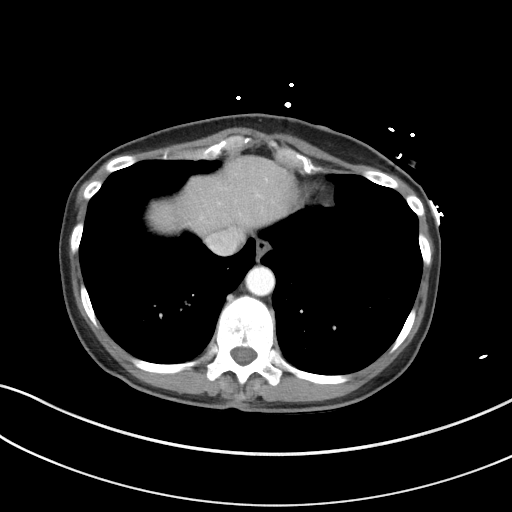

[Series 5: coronal st · coronal · 0.62mm/px · 3 of 70 slices shown]
[im 24/70  soft-tissue]
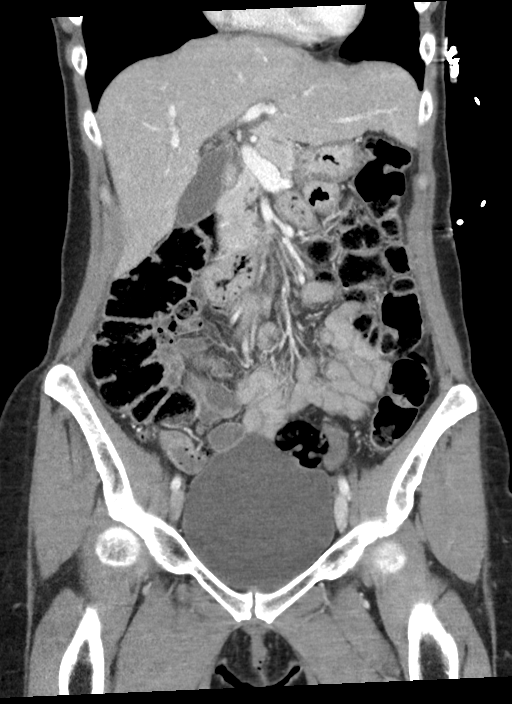
[im 31/70  soft-tissue]
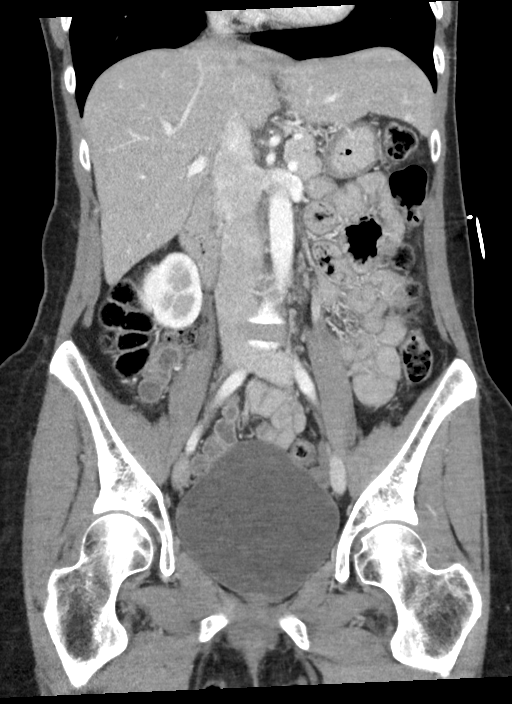
[im 39/70  soft-tissue]
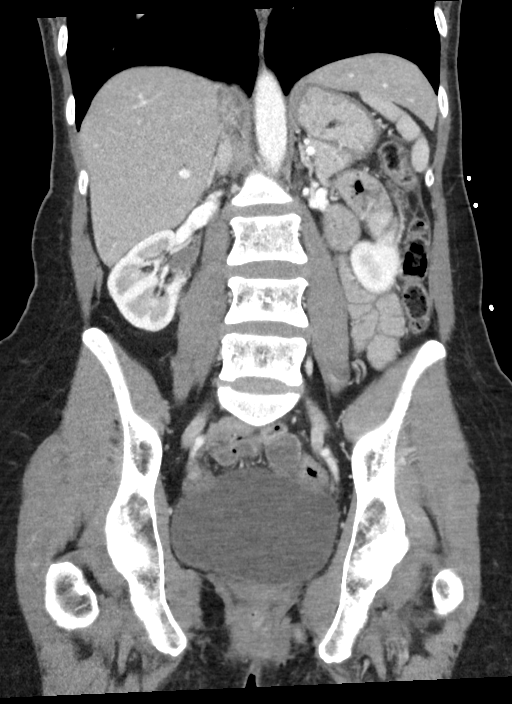

[16 of 46 positions shown; findings below may reference images not displayed]

FINDINGS: Lower chest: Clear lung bases. Normal heart size without pericardial
or pleural effusion.

Hepatobiliary: 10 mm hyperenhancing focus within the high left
hepatic lobe on image [DATE]. Normal gallbladder, without biliary
ductal dilatation.

Pancreas: Normal, without mass or ductal dilatation.

Spleen: Normal in size, without focal abnormality.

Adrenals/Urinary Tract: Normal adrenal glands. Normal kidneys,
without hydronephrosis. Normal urinary bladder.

Stomach/Bowel: The proximal stomach is underdistended. Apparent wall
thickening is favored to be secondary. Normal colon, appendix, and
terminal ileum. Normal small bowel.

Vascular/Lymphatic: Normal caliber of the aorta and branch vessels.
No abdominopelvic adenopathy.

Reproductive: Hysterectomy.  No adnexal mass.

Other: No significant free fluid.

Musculoskeletal: Minimal convex left lumbar spine curvature.
IMPRESSION: 1. No acute process in the abdomen or pelvis. no explanation for
abdominopelvic pain.
2. A high left hepatic lobe hyperenhancing lesion is favored to
represent a flash fill hemangioma. If there is any history of
primary malignancy or known liver disease, consider further
evaluation with nonemergent outpatient pre and post contrast
abdominal MRI.

## 2019-11-06 IMAGING — MR MR LUMBAR SPINE W/O CM
5 series · 31 of 48 positions shown · non-contrast
Comparison: None.

CLINICAL DATA: Left leg weakness and back pain

EXAM:
MRI LUMBAR SPINE WITHOUT CONTRAST
TECHNIQUE: Multiplanar, multisequence MR imaging of the lumbar spine was
performed. No intravenous contrast was administered.

[Series 5: T2 · sagittal · 4.0mm · 0.81mm/px · 6 of 17 slices shown (1 of 2)]
[im 1/17]
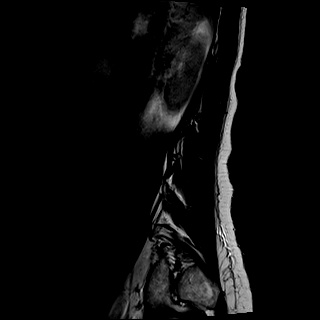
[im 4/17]
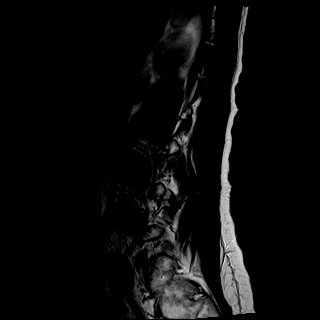
[im 7/17]
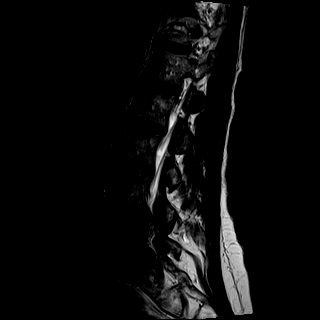
[im 10/17]
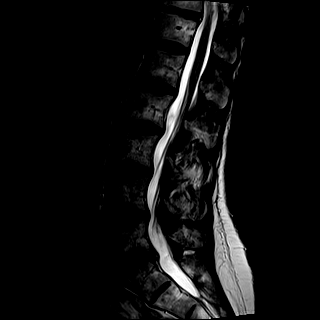
[im 13/17]
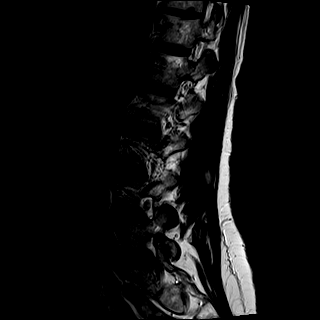
[im 17/17]
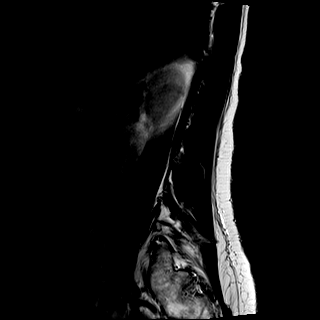

[Series 6: T1 · sagittal · 4.0mm · 0.81mm/px · 7 of 17 slices shown (1 of 2)]
[im 1/17]
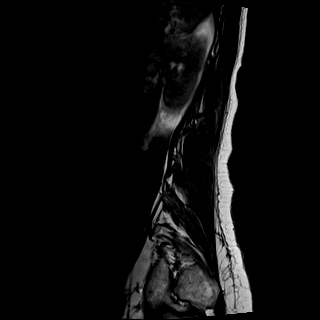
[im 3/17]
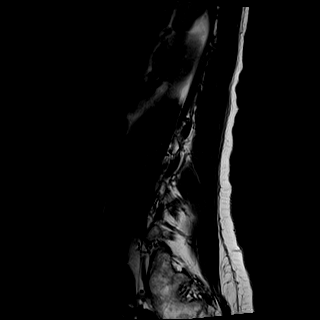
[im 6/17]
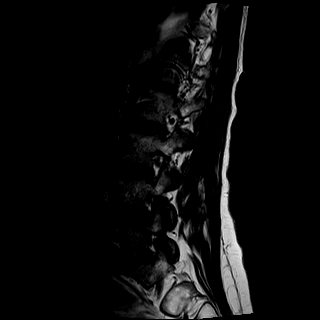
[im 9/17]
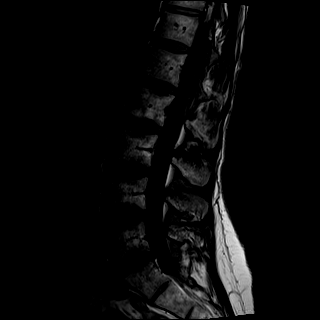
[im 11/17]
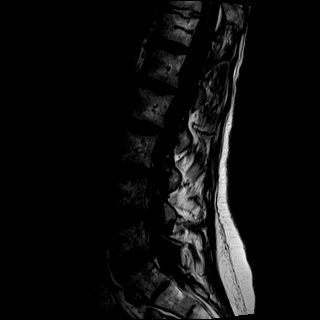
[im 14/17]
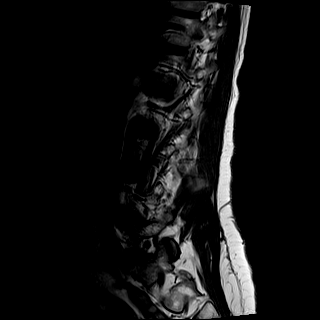
[im 17/17]
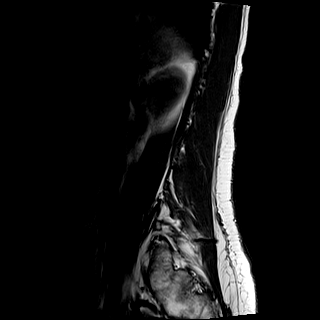

[Series 7: STIR · sagittal · 4.0mm · 0.41mm/px · 2 of 17 slices shown]
[im 1/17]
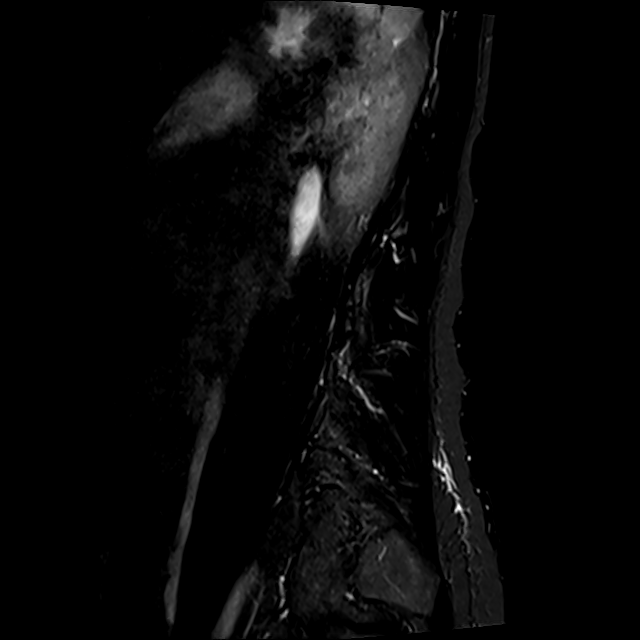
[im 3/17]
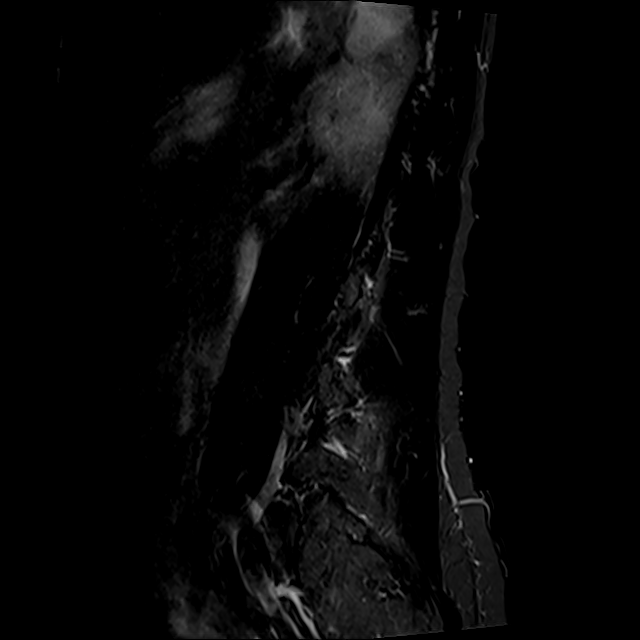

[Series 8: T2 · axial · 4.0mm · 0.78mm/px · z∈[-60,+155]mm · 8 of 36 slices shown (2 of 2)]
[im 1/36]
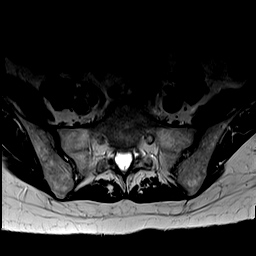
[im 6/36]
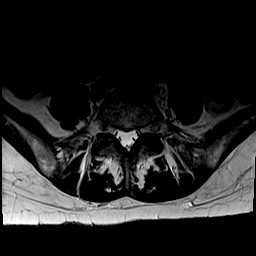
[im 11/36]
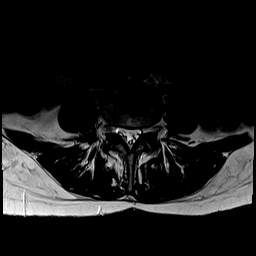
[im 17/36]
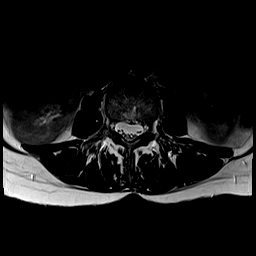
[im 19/36]
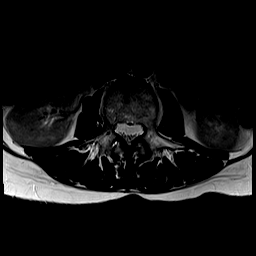
[im 25/36]
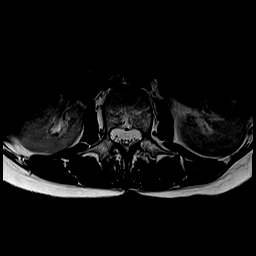
[im 30/36]
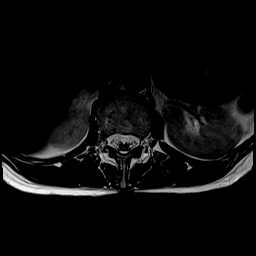
[im 36/36]
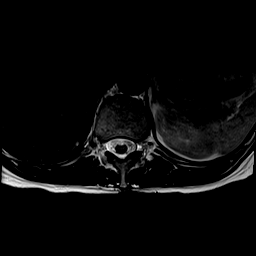

[Series 9: T1 · axial · 4.0mm · 0.39mm/px · z∈[-60,+155]mm · 8 of 36 slices shown (2 of 2)]
[im 1/36]
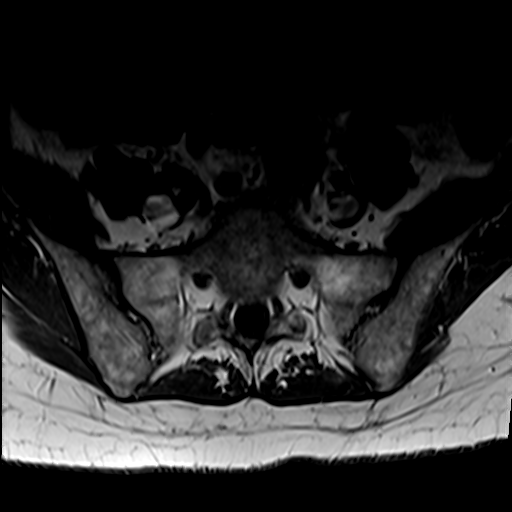
[im 6/36]
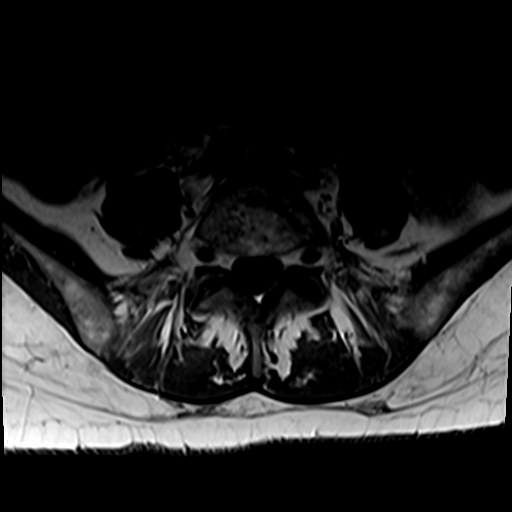
[im 11/36]
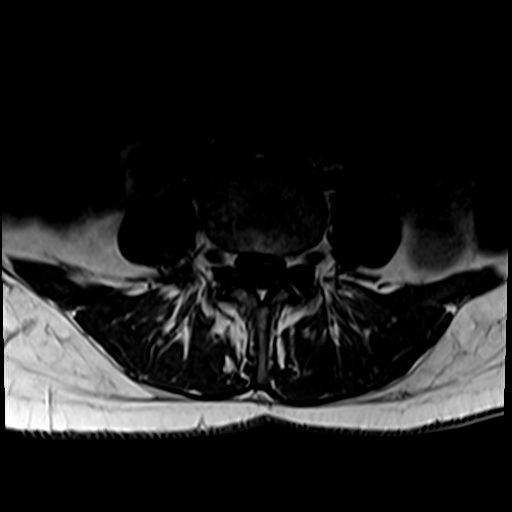
[im 17/36]
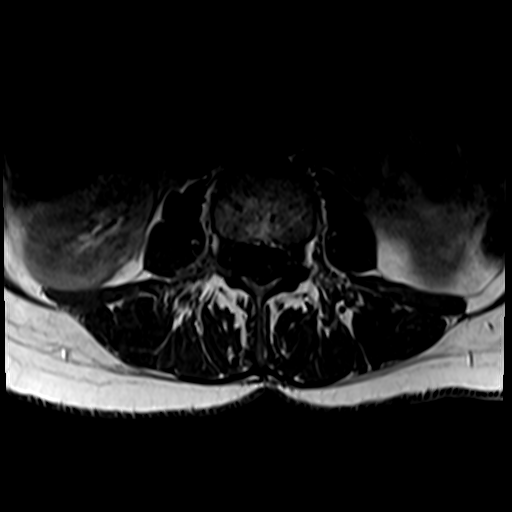
[im 19/36]
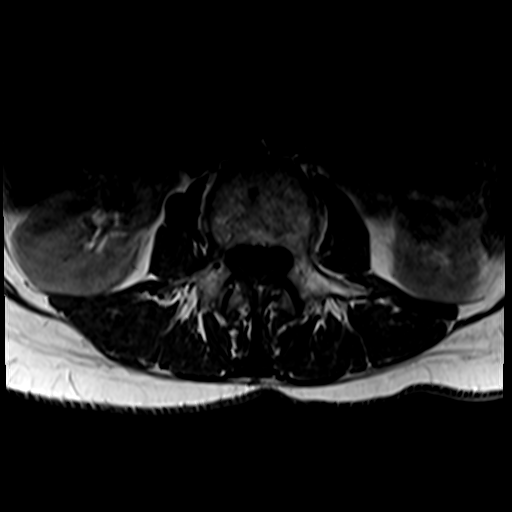
[im 25/36]
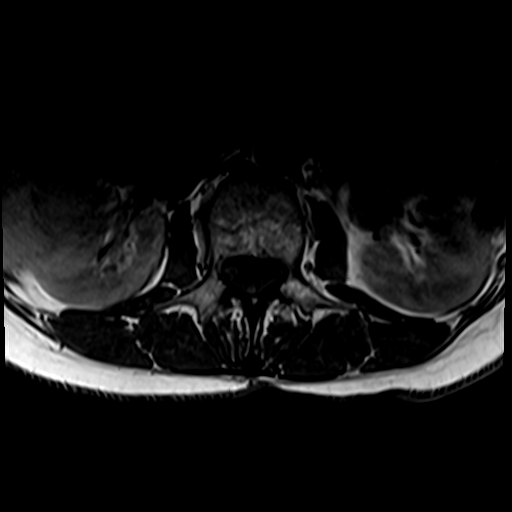
[im 30/36]
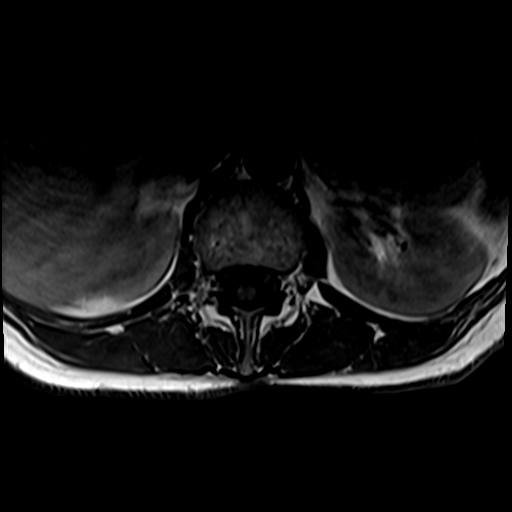
[im 36/36]
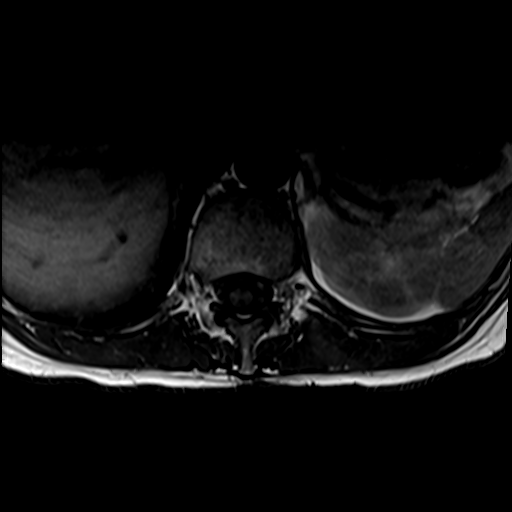

[31 of 48 positions shown; findings below may reference images not displayed]

FINDINGS: Segmentation:  Normal

Alignment:  Normal

Vertebrae:  Normal bone marrow.  Negative for fracture or mass.

Conus medullaris and cauda equina: Conus extends to the L2 level.
Conus and cauda equina appear normal.

Paraspinal and other soft tissues: Negative for paraspinous mass or
fluid collection

Disc levels:

L1-2: Mild disc degeneration with Schmorl's node. Negative for disc
protrusion or stenosis

L2-3: Mild disc bulging. Small extraforaminal disc protrusion on the
left without significant stenosis

L3-4: Diffuse disc bulging and mild facet degeneration. Negative for
stenosis.

L4-5: Mild disc degeneration and disc bulging. Small left-sided disc
protrusion with subarticular stenosis on the left. Mild facet
degeneration. No significant spinal stenosis

L5-S1: Small central disc protrusion without neural impingement.
IMPRESSION: Shallow extraforaminal disc protrusion on the left at L2-3 without
neural impingement

Small left-sided disc protrusion L4-5 with subarticular stenosis and
possible impingement of the left L5 nerve root.

Small central disc protrusion L5-S1 without impingement.

## 2020-01-29 ENCOUNTER — Telehealth: Payer: Self-pay

## 2020-01-29 ENCOUNTER — Other Ambulatory Visit: Payer: Self-pay

## 2020-01-29 DIAGNOSIS — R748 Abnormal levels of other serum enzymes: Secondary | ICD-10-CM

## 2020-01-29 NOTE — Telephone Encounter (Signed)
-----   Message from [place], [place] sent at 08/08/2019  3:16 PM EDT ----- Pt needs repeat LFT's to recheck her alkaline phos. Pt aware.

## 2020-01-29 NOTE — Telephone Encounter (Signed)
MyChart message sent to patient advising her it is time to recheck her liver enzymes.

## 2020-03-18 ENCOUNTER — Other Ambulatory Visit: Payer: Self-pay

## 2020-03-18 ENCOUNTER — Ambulatory Visit: Payer: BC Managed Care – PPO | Attending: Student | Admitting: Physical Therapy

## 2020-03-18 VITALS — BP 114/60 | HR 79

## 2020-03-18 DIAGNOSIS — M6281 Muscle weakness (generalized): Secondary | ICD-10-CM

## 2020-03-18 DIAGNOSIS — R2681 Unsteadiness on feet: Secondary | ICD-10-CM

## 2020-03-18 NOTE — Therapy (Signed)
[place] MAIN [facility] SERVICES [address] [place], [place], [address] Phone: [phone]   Fax:  [phone]  Physical Therapy Evaluation  Patient Details  Name: Bethany Farmer MRN: [id number] Date of Birth: 06-23-1960 Referring Provider (PT): Dr. Ferrel Logan   Encounter Date: 03/18/2020   PT End of Session - 03/18/20 1644    Visit Number 1    Number of Visits 17    Date for PT Re-Evaluation 05/13/20    Authorization Type PT eval: 03/18/20    PT Start Time 1530    PT Stop Time 1630    PT Time Calculation (min) 60 min    Activity Tolerance Patient tolerated treatment well    Behavior During Therapy [facility] for tasks assessed/performed           Past Medical History:  Diagnosis Date   Arthritis    hands   Cancer ([place])    Transverse myelitis ([place])    questionable in left leg    Past Surgical History:  Procedure Laterality Date   ABDOMINAL HYSTERECTOMY     COLONOSCOPY WITH PROPOFOL N/A 09/03/2019   Procedure: COLONOSCOPY WITH PROPOFOL;  Surgeon: Lucilla Lame, MD;  Location: [place];  Service: Endoscopy;  Laterality: N/A;  priority 4   POLYPECTOMY N/A 09/03/2019   Procedure: POLYPECTOMY;  Surgeon: Lucilla Lame, MD;  Location: [place];  Service: Endoscopy;  Laterality: N/A;   TONSILLECTOMY      Vitals:   03/18/20 1553  BP: 114/60  Pulse: 79  SpO2: 97%      Subjective Assessment - 03/18/20 1617    Subjective Patient reports that she did not take her medications this morning so her nerves in her midback are irritated right now.    Pertinent History 59 y.o. female with a history ofmonophasic transverse myelitis, osteoporosis, pernicious anemia, hypoparathyroidism who presents for first-time evaluation at the [facility] for transverse myelitis. She does not meet criteria of MS based on her clinical symptoms, and will need to review her MRI's once we can obtain those imaging. She had one episode of TM that may  have been post-infectious and weakness/sensory symptoms had plateau'd since then. But a few months ago developed new right lower extremity sensory symptoms that were spreading up her right leg slowly-- unclear if this would be consistent with a relapse given the slow progression of the symptoms. We discussed that the right lower extremity symptoms may be related to lumbar radiculopathy and should check an MRI L-spine and repeat MRI T-spine given these new symptoms.    Limitations Standing;Walking;House hold activities;Sitting    How long can you sit comfortably? 20 mins    How long can you stand comfortably? 10 mins    How long can you walk comfortably? 45 mins    Currently in Pain? No/denies              [facility] PT Assessment - 03/18/20 0001      Assessment   Medical Diagnosis Transverse Myletitis     Referring Provider (PT) Dr. Ferrel Logan    Onset Date/Surgical Date 06/10/18    Hand Dominance Right    Next MD Visit Not known at this time    Prior Therapy NA      Precautions   Precautions None      Restrictions   Weight Bearing Restrictions No      Balance Screen   Has the patient fallen in the past 6 months No  Has the patient had a decrease in activity level because of a fear of falling?  No    Is the patient reluctant to leave their home because of a fear of falling?  No      Home Environment   Living Environment Private residence    Living Arrangements Spouse/significant other    Available Help at Discharge Family;Friend(s)    Type of [place] to enter    Entrance Stairs-Number of Steps 2    Entrance Stairs-Rails None    Home Layout Able to live on main level with bedroom/bathroom;One level      Prior Function   Level of Independence Independent    Vocation Retired      Standardized Balance Assessment   Standardized Balance Assessment Berg Balance Test;Five Times Sit to Stand;Timed Up and Go Test    Five times sit to stand comments  18.35       Berg Balance Test   Sit to Stand Able to stand  independently using hands    Standing Unsupported Able to stand safely 2 minutes    Sitting with Back Unsupported but Feet Supported on Floor or Stool Able to sit safely and securely 2 minutes    Stand to Sit Sits safely with minimal use of hands    Transfers Able to transfer safely, minor use of hands    Standing Unsupported with Eyes Closed Able to stand 10 seconds with supervision    Standing Unsupported with Feet Together Able to place feet together independently and stand for 1 minute with supervision    From Standing, Reach Forward with Outstretched Arm Can reach confidently >25 cm (10")    From Standing Position, Pick up Object from Floor Able to pick up shoe, needs supervision    From Standing Position, Turn to Look Behind Over each Shoulder Looks behind from both sides and weight shifts well    Turn 360 Degrees Able to turn 360 degrees safely but slowly    Standing Unsupported, Alternately Place Feet on Step/Stool Able to complete 4 steps without aid or supervision    Standing Unsupported, One Foot in [employer] balance while stepping or standing    Standing on One Leg Tries to lift leg/unable to hold 3 seconds but remains standing independently    Total Score 41      Timed Up and Go Test   Normal TUG (seconds) 11           SUBJECTIVE Chief complaint: loss of balance  Onset: February 2020 Recent changes in overall health/medication: No Prior history of physical therapy for balance: None   OBJECTIVE  MUSCULOSKELETAL: Tremor: Absent Bulk: Normal Tone: Normal, no clonus   Strength R/L 4-/3+ Hip flexion 4/3+ Hip external rotation 4/3+ Hip internal rotation 4/3+ Hip abduction 4/3+ Hip adduction 4/4 Knee extension 4+/4+ Knee flexion 4+/4+ Ankle Plantarflexion 4+/4 Ankle Dorsiflexion   NEUROLOGICAL:  Sensation Grossly impaired sensation; increased numbness and tingling with deep and light pressure on L2-S1  dermatome pattern Proprioception and hot/cold testing deferred on this date    FUNCTIONAL OUTCOME MEASURES   Results Comments  BERG 41/56 Fall risk, in need of intervention  TUG 11 seconds   5TSTS 18.5 seconds   FOTO 64     ASSESSMENT 59 y.o. female presents to skilled physical therapy referred for transverse myelitis due to difficulty with balance. PT examinations revealed deficits in LLE gross strength as evidenced by 3+/5 on MMT. Patient has  a score of 41/56 on Berg Balance Scale placing patient at moderate risk for falls. Patient demonstrates decreased strength and balance deficits with a score of 18.5 seconds on the 5 time sit to stand test. Pt will benefit from skilled PT services to address deficits in balance and decrease risk for future falls.     PLAN Next Visit: Initiate hip strengthening and balance exercises HEP: initiate HEP   Objective measurements completed on examination: See above findings.          PT Short Term Goals - 03/18/20 1647      PT SHORT TERM GOAL #1   Title Patient will be independent in home exercise program to improve strength/mobility for better functional independence with ADLs.    Time 4    Period Weeks    Status New    Target Date 04/15/20      PT SHORT TERM GOAL #2   Title Patient will ascend/descend 4 stairs without rail assist independently without loss of balance to improve ability to get in/out of home.    Baseline 11/30: CGA, LOB turning to L    Time 8    Period Weeks    Status New    Target Date 05/13/20             PT Long Term Goals - 03/18/20 1647      PT LONG TERM GOAL #1   Title Patient will increase FOTO score to equal to or greater than 71 to demonstrate statistically significant improvement in mobility and quality of life.    Baseline 11/30: 64    Time 8    Period Weeks    Status New    Target Date 05/13/20      PT LONG TERM GOAL #2   Title Patient (< 74 years old) will complete five times sit to stand  test in < 10 seconds indicating an increased LE strength and improved balance.    Baseline 11/30:18.35    Time 8    Period Weeks    Status New    Target Date 05/13/20      PT LONG TERM GOAL #3   Title Patient will increase Berg Balance score by > 6 points to demonstrate decreased fall risk during functional activities.    Baseline 11/30: 41/56    Time 8    Period Weeks    Status New    Target Date 05/13/20      PT LONG TERM GOAL #4   Title Patient will increase LLE gross strength to 4+/5 as to improve functional strength for independent gait, increased standing tolerance and increased ADL ability    Baseline 11/30: LLE gross strength 3+/5    Time 8    Period Weeks    Status New    Target Date 05/13/20                   Plan - 03/18/20 1731    Clinical Impression Statement 59 y.o. female presents to skilled physical therapy referred for transverse myelitis due to difficulty with balance. PT examinations revealed deficits in LLE gross strength as evidenced by 3+/5 on MMT. Patient has a score of 41/56 on Berg Balance Scale placing patient at moderate risk for falls. Patient demonstrates decreased strength and balance deficits with a score of 18.5 seconds on the 5 time sit to stand test. Pt will benefit from skilled PT services to address deficits in balance and decrease risk for future falls.  Personal Factors and Comorbidities Comorbidity 3+    Comorbidities transverse myelitis, osteoporosis, pernicious anemia, hypoparathyroidism    Examination-Activity Limitations Sit;Stand;Bed Mobility;Stairs    Examination-Participation Restrictions Cleaning;Community Activity    Stability/Clinical Decision Making Evolving/Moderate complexity    Clinical Decision Making Moderate    Rehab Potential Fair    PT Frequency 2x / week    PT Duration 8 weeks    PT Treatment/Interventions Electrical Stimulation;ADLs/Self Care Home Management;Gait training;Stair training;Functional mobility  training;Therapeutic activities;Therapeutic exercise;Balance training;Neuromuscular re-education    PT Next Visit Plan Initiate hip strengthening and balance exercises           Patient will benefit from skilled therapeutic intervention in order to improve the following deficits and impairments:  Decreased activity tolerance, Decreased endurance, Decreased strength, Improper body mechanics, Decreased balance, Decreased mobility, Decreased safety awareness, Difficulty walking, Impaired flexibility  Visit Diagnosis: Muscle weakness (generalized)  Unsteadiness on feet     Problem List Patient Active Problem List   Diagnosis Date Noted   Personal history of colonic polyps    Polyp of transverse colon    Left leg numbness 06/14/2018   Osteoporosis 06/14/2018   Karl Luke PT, DPT Netta Corrigan 03/18/2020, 5:35 PM  [place] [facility] SERVICES [address] [place], [place], [address] Phone: [phone]   Fax:  [phone]  Name: Bethany Farmer MRN: [id number] Date of Birth: Nov 20, 1960

## 2020-03-25 ENCOUNTER — Ambulatory Visit: Payer: BC Managed Care – PPO | Attending: Student | Admitting: Physical Therapy

## 2020-03-25 ENCOUNTER — Other Ambulatory Visit: Payer: Self-pay

## 2020-03-25 DIAGNOSIS — R2681 Unsteadiness on feet: Secondary | ICD-10-CM | POA: Diagnosis present

## 2020-03-25 DIAGNOSIS — M6281 Muscle weakness (generalized): Secondary | ICD-10-CM | POA: Insufficient documentation

## 2020-03-25 NOTE — Therapy (Signed)
[place] MAIN [facility] SERVICES [address] [place], [place], [address] Phone: [phone]   Fax:  [phone]  Physical Therapy Treatment  Patient Details  Name: Bethany Farmer MRN: [id number] Date of Birth: December 11, 1960 Referring Provider (PT): Dr. Ferrel Logan   Encounter Date: 03/25/2020   PT End of Session - 03/25/20 1434    Visit Number 2    Number of Visits 17    Date for PT Re-Evaluation 05/13/20    Authorization Type PT eval: 03/18/20    PT Start Time 1430    PT Stop Time 1515    PT Time Calculation (min) 45 min    Activity Tolerance Patient tolerated treatment well    Behavior During Therapy [facility] for tasks assessed/performed           Past Medical History:  Diagnosis Date  . Arthritis    hands  . Cancer ([place])   . Transverse myelitis ([place])    questionable in left leg    Past Surgical History:  Procedure Laterality Date  . ABDOMINAL HYSTERECTOMY    . COLONOSCOPY WITH PROPOFOL N/A 09/03/2019   Procedure: COLONOSCOPY WITH PROPOFOL;  Surgeon: Lucilla Lame, MD;  Location: [place];  Service: Endoscopy;  Laterality: N/A;  priority 4  . POLYPECTOMY N/A 09/03/2019   Procedure: POLYPECTOMY;  Surgeon: Lucilla Lame, MD;  Location: [place];  Service: Endoscopy;  Laterality: N/A;  . TONSILLECTOMY      There were no vitals filed for this visit.   Subjective Assessment - 03/25/20 1433    Subjective Patient denies of any falls since last therapy session. She states there was moderate soreness in her L quads yesterday to where she could barely pick it up.    Pertinent History 59 y.o. female with a history ofmonophasic transverse myelitis, osteoporosis, pernicious anemia, hypoparathyroidism who presents for first-[facility] evaluation at the [facility] for transverse myelitis. She does not meet criteria of MS based on her clinical symptoms, and will need to review her MRI's once we can obtain those imaging. She had one episode of  TM that may have been post-infectious and weakness/sensory symptoms had plateau'd since then. But a few months ago developed new right lower extremity sensory symptoms that were spreading up her right leg slowly-- unclear if this would be consistent with a relapse given the slow progression of the symptoms. We discussed that the right lower extremity symptoms may be related to lumbar radiculopathy and should check an MRI L-spine and repeat MRI T-spine given these new symptoms.    Limitations Standing;Walking;House hold activities;Sitting    How long can you sit comfortably? 20 mins    How long can you stand comfortably? 10 mins    How long can you walk comfortably? 45 mins    Currently in Pain? Yes    Pain Score 4     Pain Location Back    Pain Descriptors / Indicators Aching    Pain Type Chronic pain           All exercises were completed with 2.5# ankle weights   Supine SLR x 20 reps each leg Supine TA activation x 20 reps Supine bridges x 20 reps Supine posterior/anterior pelvic tilt x 20 reps   Seated SAQ x 20 reps Seated marches x 20 reps  Airex x EO/EC x 3 30 seconds each Airex x NBOS x EO/EC x 3 30 seconds each Airex x WBOS EO/EC x 3 30 seconds each Airex x head  turns horizontal/vertical x 3 30 seconds each            PT Education - 03/25/20 1531    Education Details HEP program    Person(s) Educated Patient    Methods Explanation;Demonstration;Handout    Comprehension Verbalized understanding;Returned demonstration            PT Short Term Goals - 03/18/20 1647      PT SHORT TERM GOAL #1   Title Patient will be independent in home exercise program to improve strength/mobility for better functional independence with ADLs.    Time 4    Period Weeks    Status New    Target Date 04/15/20      PT SHORT TERM GOAL #2   Title Patient will ascend/descend 4 stairs without rail assist independently without loss of balance to improve ability to get in/out of  home.    Baseline 11/30: CGA, LOB turning to L    Time 4    Period Weeks    Status New    Target Date 04/15/20             PT Long Term Goals - 03/18/20 1647      PT LONG TERM GOAL #1   Title Patient will increase FOTO score to equal to or greater than 71 to demonstrate statistically significant improvement in mobility and quality of life.    Baseline 11/30: 64    Time 8    Period Weeks    Status New    Target Date 05/13/20      PT LONG TERM GOAL #2   Title Patient (< 71 years old) will complete five times sit to stand test in < 10 seconds indicating an increased LE strength and improved balance.    Baseline 11/30:18.35    Time 8    Period Weeks    Status New    Target Date 05/13/20      PT LONG TERM GOAL #3   Title Patient will increase Berg Balance score by > 6 points to demonstrate decreased fall risk during functional activities.    Baseline 11/30: 41/56    Time 8    Period Weeks    Status New    Target Date 05/13/20      PT LONG TERM GOAL #4   Title Patient will increase LLE gross strength to 4+/5 as to improve functional strength for independent gait, increased standing tolerance and increased ADL ability    Baseline 11/30: LLE gross strength 3+/5    Time 8    Period Weeks    Status New    Target Date 05/13/20                 Plan - 03/25/20 1522    Clinical Impression Statement Patient demonstrates good motivation throughout therapy session. Patient was able to tolerated strengthening exercises with minimal increase in pain along midback/L Q/L region. She exhibits increased postural swaying with NBOS on airex due to decreaed sensation along L foot. Therapy will continue to focus on incresing BLE strength, improve balance deficits, and increase functional activity tolerance to improve patient's quality of life.    Personal Factors and Comorbidities Comorbidity 3+    Comorbidities transverse myelitis, osteoporosis, pernicious anemia, hypoparathyroidism     Examination-Activity Limitations Sit;Stand;Bed Mobility;Stairs    Examination-Participation Restrictions Cleaning;Community Activity    Stability/Clinical Decision Making Evolving/Moderate complexity    Rehab Potential Fair    PT Frequency 2x / week  PT Duration 8 weeks    PT Treatment/Interventions Electrical Stimulation;ADLs/Self Care Home Management;Gait training;Stair training;Functional mobility training;Therapeutic activities;Therapeutic exercise;Balance training;Neuromuscular re-education    PT Next Visit Plan Initiate hip strengthening and balance exercises    PT Home Exercise Plan HEP: SLR, TA activation in supine, bridges, seated marching           Patient will benefit from skilled therapeutic intervention in order to improve the following deficits and impairments:  Decreased activity tolerance, Decreased endurance, Decreased strength, Improper body mechanics, Decreased balance, Decreased mobility, Decreased safety awareness, Difficulty walking, Impaired flexibility  Visit Diagnosis: Muscle weakness (generalized)  Unsteadiness on feet     Problem List Patient Active Problem List   Diagnosis Date Noted  . Personal history of colonic polyps   . Polyp of transverse colon   . Left leg numbness 06/14/2018  . Osteoporosis 06/14/2018   Karl Luke PT, DPT Netta Corrigan 03/25/2020, 3:33 PM  [place] MAIN [facility] SERVICES [address] [place], [place], [address] Phone: [phone]   Fax:  [phone]  Name: Bethany Farmer MRN: [id number] Date of Birth: 06-09-1960

## 2020-03-27 ENCOUNTER — Other Ambulatory Visit: Payer: Self-pay

## 2020-03-27 ENCOUNTER — Ambulatory Visit: Payer: BC Managed Care – PPO | Admitting: Physical Therapy

## 2020-03-27 DIAGNOSIS — M6281 Muscle weakness (generalized): Secondary | ICD-10-CM

## 2020-03-27 DIAGNOSIS — R2681 Unsteadiness on feet: Secondary | ICD-10-CM

## 2020-03-27 NOTE — Therapy (Signed)
[place] MAIN [facility] SERVICES [address] [place], [place], [address] Phone: [phone]   Fax:  [phone]  Physical Therapy Treatment  Patient Details  Name: Bethany Farmer MRN: [id number] Date of Birth: Apr 16, 1961 Referring Provider (PT): Dr. Ferrel Logan   Encounter Date: 03/27/2020   PT End of Session - 03/27/20 1440    Visit Number 3    Number of Visits 17    Date for PT Re-Evaluation 05/13/20    Authorization Type PT eval: 03/18/20    PT Start Time 1435    PT Stop Time 1515    PT Time Calculation (min) 40 min    Activity Tolerance Patient tolerated treatment well    Behavior During Therapy [facility] for tasks assessed/performed           Past Medical History:  Diagnosis Date  . Arthritis    hands  . Cancer ([place])   . Transverse myelitis ([place])    questionable in left leg    Past Surgical History:  Procedure Laterality Date  . ABDOMINAL HYSTERECTOMY    . COLONOSCOPY WITH PROPOFOL N/A 09/03/2019   Procedure: COLONOSCOPY WITH PROPOFOL;  Surgeon: Lucilla Lame, MD;  Location: [place];  Service: Endoscopy;  Laterality: N/A;  priority 4  . POLYPECTOMY N/A 09/03/2019   Procedure: POLYPECTOMY;  Surgeon: Lucilla Lame, MD;  Location: [place];  Service: Endoscopy;  Laterality: N/A;  . TONSILLECTOMY      There were no vitals filed for this visit.   Subjective Assessment - 03/27/20 1528    Subjective Patient reports that she has minimal soreness in her left thoracic/lumbar paraspinals since last therapy session. She denies of any pain or falls since last therapy session.    Pertinent History 59 y.o. female with a history ofmonophasic transverse myelitis, osteoporosis, pernicious anemia, hypoparathyroidism who presents for first-time evaluation at the [facility] for transverse myelitis. She does not meet criteria of MS based on her clinical symptoms, and will need to review her MRI's once we can obtain those imaging. She had  one episode of TM that may have been post-infectious and weakness/sensory symptoms had plateau'd since then. But a few months ago developed new right lower extremity sensory symptoms that were spreading up her right leg slowly-- unclear if this would be consistent with a relapse given the slow progression of the symptoms. We discussed that the right lower extremity symptoms may be related to lumbar radiculopathy and should check an MRI L-spine and repeat MRI T-spine given these new symptoms.    Limitations Standing;Walking;House hold activities;Sitting    How long can you sit comfortably? 20 mins    How long can you stand comfortably? 10 mins    How long can you walk comfortably? 45 mins    Currently in Pain? No/denies           All exercises were completed with 2.5# ankle weights   Supine SLR x 25 reps each leg Supine TA activation x 30 reps Supine TA activation with marches x 15 reps Supine bridges x 25 reps Supine clamshells with GTB x 20 reps Supine bridge then clamshell with GTB x 15 Supine posterior/anterior pelvic tilt x 20 reps  Sidelying clamshell with GTB holds each side x 3 30s Double knee to chest with blue theraball x 10 10s holds Lower trunk rotation with blue theraball x 10 10s holds   Seated SAQ x 25 reps Seated marches x 25 reps  PT Short Term Goals - 03/18/20 1647      PT SHORT TERM GOAL #1   Title Patient will be independent in home exercise program to improve strength/mobility for better functional independence with ADLs.    Time 4    Period Weeks    Status New    Target Date 04/15/20      PT SHORT TERM GOAL #2   Title Patient will ascend/descend 4 stairs without rail assist independently without loss of balance to improve ability to get in/out of home.    Baseline 11/30: CGA, LOB turning to L    Time 4    Period Weeks    Status New    Target Date 04/15/20             PT Long Term Goals - 03/18/20 1647      PT LONG TERM GOAL #1    Title Patient will increase FOTO score to equal to or greater than 71 to demonstrate statistically significant improvement in mobility and quality of life.    Baseline 11/30: 64    Time 8    Period Weeks    Status New    Target Date 05/13/20      PT LONG TERM GOAL #2   Title Patient (< 67 years old) will complete five times sit to stand test in < 10 seconds indicating an increased LE strength and improved balance.    Baseline 11/30:18.35    Time 8    Period Weeks    Status New    Target Date 05/13/20      PT LONG TERM GOAL #3   Title Patient will increase Berg Balance score by > 6 points to demonstrate decreased fall risk during functional activities.    Baseline 11/30: 41/56    Time 8    Period Weeks    Status New    Target Date 05/13/20      PT LONG TERM GOAL #4   Title Patient will increase LLE gross strength to 4+/5 as to improve functional strength for independent gait, increased standing tolerance and increased ADL ability    Baseline 11/30: LLE gross strength 3+/5    Time 8    Period Weeks    Status New    Target Date 05/13/20                 Plan - 03/27/20 1522    Clinical Impression Statement Introduced more core stabilization exercises today with fair tolerance to activity. Patient states she has minimal "pulling" on her tender site of transverse myelitis along her left lumbar paraspinals. She demonstrates good muscle recruitment with TA activation and requires minimal verbal cues to complete exercise. Therapy will continue to progress patient with more low back stretches, core stabilization, and decrease pain to improve patient's quality of life.    Personal Factors and Comorbidities Comorbidity 3+    Comorbidities transverse myelitis, osteoporosis, pernicious anemia, hypoparathyroidism    Examination-Activity Limitations Sit;Stand;Bed Mobility;Stairs    Examination-Participation Restrictions Cleaning;Community Activity    Stability/Clinical Decision Making  Evolving/Moderate complexity    Rehab Potential Fair    PT Frequency 2x / week    PT Duration 8 weeks    PT Treatment/Interventions Electrical Stimulation;ADLs/Self Care Home Management;Gait training;Stair training;Functional mobility training;Therapeutic activities;Therapeutic exercise;Balance training;Neuromuscular re-education    PT Next Visit Plan Initiate hip strengthening and balance exercises    PT Home Exercise Plan HEP: SLR, TA activation in supine, bridges, seated marching  Patient will benefit from skilled therapeutic intervention in order to improve the following deficits and impairments:  Decreased activity tolerance,Decreased endurance,Decreased strength,Improper body mechanics,Decreased balance,Decreased mobility,Decreased safety awareness,Difficulty walking,Impaired flexibility  Visit Diagnosis: Muscle weakness (generalized)  Unsteadiness on feet     Problem List Patient Active Problem List   Diagnosis Date Noted  . Personal history of colonic polyps   . Polyp of transverse colon   . Left leg numbness 06/14/2018  . Osteoporosis 06/14/2018   Karl Luke PT, DPT Netta Corrigan 03/27/2020, 3:30 PM  [place] MAIN [facility] SERVICES [address] [place], [place], [address] Phone: [phone]   Fax:  [phone]  Name: Bethany Farmer MRN: [id number] Date of Birth: 1960/12/13

## 2020-03-31 ENCOUNTER — Ambulatory Visit: Payer: BC Managed Care – PPO

## 2020-04-01 ENCOUNTER — Ambulatory Visit: Payer: BC Managed Care – PPO

## 2020-04-01 ENCOUNTER — Other Ambulatory Visit: Payer: Self-pay

## 2020-04-01 DIAGNOSIS — R2681 Unsteadiness on feet: Secondary | ICD-10-CM

## 2020-04-01 DIAGNOSIS — M6281 Muscle weakness (generalized): Secondary | ICD-10-CM

## 2020-04-01 NOTE — Therapy (Signed)
[place] MAIN [facility] SERVICES [address] [place], [place], [address] Phone: [phone]   Fax:  [phone]  Physical Therapy Treatment  Patient Details  Name: Bethany Farmer MRN: [id number] Date of Birth: 04-23-1960 Referring Provider (PT): Dr. Ferrel Logan   Encounter Date: 04/01/2020   PT End of Session - 04/01/20 1022    Visit Number 4    Number of Visits 17    Date for PT Re-Evaluation 05/13/20    Authorization Type PT eval: 03/18/20    PT Start Time 1018    PT Stop Time 1059    PT Time Calculation (min) 41 min    Activity Tolerance Patient tolerated treatment well    Behavior During Therapy [facility] for tasks assessed/performed           Past Medical History:  Diagnosis Date  . Arthritis    hands  . Cancer ([place])   . Transverse myelitis ([place])    questionable in left leg    Past Surgical History:  Procedure Laterality Date  . ABDOMINAL HYSTERECTOMY    . COLONOSCOPY WITH PROPOFOL N/A 09/03/2019   Procedure: COLONOSCOPY WITH PROPOFOL;  Surgeon: Lucilla Lame, MD;  Location: [place];  Service: Endoscopy;  Laterality: N/A;  priority 4  . POLYPECTOMY N/A 09/03/2019   Procedure: POLYPECTOMY;  Surgeon: Lucilla Lame, MD;  Location: [place];  Service: Endoscopy;  Laterality: N/A;  . TONSILLECTOMY      There were no vitals filed for this visit.   Subjective Assessment - 04/01/20 1026    Subjective The patient reports she feels no different than she usually does.    Pertinent History 59 y.o. female with a history ofmonophasic transverse myelitis, osteoporosis, pernicious anemia, hypoparathyroidism who presents for first-time evaluation at the [facility] for transverse myelitis. She does not meet criteria of MS based on her clinical symptoms, and will need to review her MRI's once we can obtain those imaging. She had one episode of TM that may have been post-infectious and weakness/sensory symptoms had plateau'd since  then. But a few months ago developed new right lower extremity sensory symptoms that were spreading up her right leg slowly-- unclear if this would be consistent with a relapse given the slow progression of the symptoms. We discussed that the right lower extremity symptoms may be related to lumbar radiculopathy and should check an MRI L-spine and repeat MRI T-spine given these new symptoms.    Limitations Standing;Walking;House hold activities;Sitting    How long can you sit comfortably? 20 mins    How long can you stand comfortably? 10 mins    How long can you walk comfortably? 45 mins           All exercises were completed with 2.5# ankle weights    Supine SLR x 25 reps each leg Supine TA activation 10"x10 eps Supine TA activation with marches x 15 reps Supine bridges x 25 reps Supine clamshells with GTB x 20 reps Supine bridge then clamshell with GTB x 15 Supine posterior/anterior pelvic tilt x 20 reps  Sidelying clamshell with GTB holds each side x 3 30s Double knee to chest with blue theraball x 10 10s holds Lower trunk rotation with blue theraball x 10 10s holds  KTOS 30"x3 each Hamstring stretch 90/90 30"x3 each   Seated SAQ x 25 reps Seated marches x 25 reps Seated rows with TA green TB x25  PT Education - 04/01/20 1049    Education Details exercise technique    Person(s) Educated Patient    Methods Explanation    Comprehension Verbalized understanding            PT Short Term Goals - 03/18/20 1647      PT SHORT TERM GOAL #1   Title Patient will be independent in home exercise program to improve strength/mobility for better functional independence with ADLs.    Time 4    Period Weeks    Status New    Target Date 04/15/20      PT SHORT TERM GOAL #2   Title Patient will ascend/descend 4 stairs without rail assist independently without loss of balance to improve ability to get in/out of home.    Baseline 11/30:  CGA, LOB turning to L    Time 4    Period Weeks    Status New    Target Date 04/15/20             PT Long Term Goals - 03/18/20 1647      PT LONG TERM GOAL #1   Title Patient will increase FOTO score to equal to or greater than 71 to demonstrate statistically significant improvement in mobility and quality of life.    Baseline 11/30: 64    Time 8    Period Weeks    Status New    Target Date 05/13/20      PT LONG TERM GOAL #2   Title Patient (< 40 years old) will complete five times sit to stand test in < 10 seconds indicating an increased LE strength and improved balance.    Baseline 11/30:18.35    Time 8    Period Weeks    Status New    Target Date 05/13/20      PT LONG TERM GOAL #3   Title Patient will increase Berg Balance score by > 6 points to demonstrate decreased fall risk during functional activities.    Baseline 11/30: 41/56    Time 8    Period Weeks    Status New    Target Date 05/13/20      PT LONG TERM GOAL #4   Title Patient will increase LLE gross strength to 4+/5 as to improve functional strength for independent gait, increased standing tolerance and increased ADL ability    Baseline 11/30: LLE gross strength 3+/5    Time 8    Period Weeks    Status New    Target Date 05/13/20                 Plan - 04/01/20 1033    Clinical Impression Statement Advanced stretching program for low back with good tolerance.  The patient presents with significant tightness low back, gluteal and hamstring muscles.  Patient demonstrated good ability to perform TA activation and maintain this position during exercise.  Mild cueing required for exercise technique.  The patient continues to benefit from skilled PT intervention to further improve core strength and flexibility for symptoms reduction and return to prior level of function.    Personal Factors and Comorbidities Comorbidity 3+    Comorbidities transverse myelitis, osteoporosis, pernicious anemia,  hypoparathyroidism    Examination-Activity Limitations Sit;Stand;Bed Mobility;Stairs    Examination-Participation Restrictions Cleaning;Community Activity    Stability/Clinical Decision Making Evolving/Moderate complexity    Rehab Potential Fair    PT Frequency 2x / week    PT Duration 8 weeks    PT Treatment/Interventions Electrical  Stimulation;ADLs/Self Care Home Management;Gait training;Stair training;Functional mobility training;Therapeutic activities;Therapeutic exercise;Balance training;Neuromuscular re-education    PT Next Visit Plan Initiate hip strengthening and balance exercises    PT Home Exercise Plan HEP: SLR, TA activation in supine, bridges, seated marching           Patient will benefit from skilled therapeutic intervention in order to improve the following deficits and impairments:  Decreased activity tolerance,Decreased endurance,Decreased strength,Improper body mechanics,Decreased balance,Decreased mobility,Decreased safety awareness,Difficulty walking,Impaired flexibility  Visit Diagnosis: Unsteadiness on feet  Muscle weakness (generalized)     Problem List Patient Active Problem List   Diagnosis Date Noted  . Personal history of colonic polyps   . Polyp of transverse colon   . Left leg numbness 06/14/2018  . Osteoporosis 06/14/2018    Bethany Farmer PT, DPT 04/01/2020, 11:02 AM  [place] MAIN [facility] SERVICES [address] [place], [place], [address] Phone: [phone]   Fax:  [phone]  Name: Bethany Farmer MRN: [id number] Date of Birth: 17-Nov-1960

## 2020-04-02 ENCOUNTER — Ambulatory Visit: Payer: BC Managed Care – PPO

## 2020-04-04 ENCOUNTER — Ambulatory Visit: Payer: BC Managed Care – PPO

## 2020-04-07 ENCOUNTER — Ambulatory Visit: Payer: BC Managed Care – PPO

## 2020-04-09 ENCOUNTER — Other Ambulatory Visit: Payer: Self-pay

## 2020-04-09 ENCOUNTER — Ambulatory Visit: Payer: BC Managed Care – PPO | Admitting: Physical Therapy

## 2020-04-09 DIAGNOSIS — M6281 Muscle weakness (generalized): Secondary | ICD-10-CM

## 2020-04-09 DIAGNOSIS — R2681 Unsteadiness on feet: Secondary | ICD-10-CM

## 2020-04-09 NOTE — Therapy (Signed)
[place] [facility] MAIN [facility] SERVICES [address] [place], [place], [address] Phone: [phone]   Fax:  [phone]  Physical Therapy Treatment  Patient Details  Name: Delaynie Stetzer MRN: [id number] Date of Birth: July 07, 1960 Referring Provider (PT): Dr. Trisha Mangle   Encounter Date: 04/09/2020   PT End of Session - 04/09/20 1227    Visit Number 5    Number of Visits 17    Date for PT Re-Evaluation 05/13/20    Authorization Type PT eval: 03/18/20    PT Start Time 1108    PT Stop Time 1150    PT Time Calculation (min) 42 min    Equipment Utilized During Treatment Gait belt    Activity Tolerance Patient tolerated treatment well;No increased pain    Behavior During Therapy WFL for tasks assessed/performed           Past Medical History:  Diagnosis Date  . Arthritis    hands  . Cancer (HCC)   . Transverse myelitis (HCC)    questionable in left leg    Past Surgical History:  Procedure Laterality Date  . ABDOMINAL HYSTERECTOMY    . COLONOSCOPY WITH PROPOFOL N/A 09/03/2019   Procedure: COLONOSCOPY WITH PROPOFOL;  Surgeon: Midge Minium, MD;  Location: [facility];  Service: Endoscopy;  Laterality: N/A;  priority 4  . POLYPECTOMY N/A 09/03/2019   Procedure: POLYPECTOMY;  Surgeon: Midge Minium, MD;  Location: [facility];  Service: Endoscopy;  Laterality: N/A;  . TONSILLECTOMY      There were no vitals filed for this visit.   Subjective Assessment - 04/09/20 1119    Subjective Patient states her MRI results had negative findings. She denies of any new symptoms or injuries since last therapy session.    Pertinent History 59 y.o. female with a history ofmonophasic transverse myelitis, osteoporosis, pernicious anemia, hypoparathyroidism who presents for first-time evaluation at the [facility] for transverse myelitis. She does not meet criteria of MS based on her clinical symptoms, and will need to review her MRI's once we can obtain  those imaging. She had one episode of TM that may have been post-infectious and weakness/sensory symptoms had plateau'd since then. But a few months ago developed new right lower extremity sensory symptoms that were spreading up her right leg slowly-- unclear if this would be consistent with a relapse given the slow progression of the symptoms. We discussed that the right lower extremity symptoms may be related to lumbar radiculopathy and should check an MRI L-spine and repeat MRI T-spine given these new symptoms.    Limitations Standing;Walking;House hold activities;Sitting    How long can you sit comfortably? 20 mins    How long can you stand comfortably? 10 mins    How long can you walk comfortably? 45 mins    Currently in Pain? Yes    Pain Score 4     Pain Location Thoracic    Pain Orientation Mid    Pain Descriptors / Indicators Aching;Constant    Pain Type Chronic pain            All exercises were completed with 2.5# ankle weights    Supine SLR x 25 reps each leg Supine TA activation with marches x 20 reps Supine dead bugs with TA activation x 20 reps  Supine bridges x 25 reps Supine clamshells with GTB x 20 reps Supine bridge then clamshell with GTB x 25 Supine posterior/anterior pelvic tilt x 20 reps  Sidelying clamshell with GTB  holds each side x 3 30s Double knee to chest with blue theraball x 10 10s holds Lower trunk rotation with blue theraball x 10 10s holds  KTOS 30"x3 each Hamstring stretch 90/90 30"x3 each   Quadruped cat/cow stretch x 10 5s holds Quadruped arm lifts x 10 reps each arm   Seated SAQ x 30 reps Seated marches x 30 reps Seated on blue theraball with TA activation x marches x 10 reps    Airex EO/EC x normal stance x 3 30s Airex EO/EC x WBOS x 3 30s Airex EO/EC x NBOS x 3 30s                          PT Short Term Goals - 03/18/20 1647      PT SHORT TERM GOAL #1   Title Patient will be independent in home exercise  program to improve strength/mobility for better functional independence with ADLs.    Time 4    Period Weeks    Status New    Target Date 04/15/20      PT SHORT TERM GOAL #2   Title Patient will ascend/descend 4 stairs without rail assist independently without loss of balance to improve ability to get in/out of home.    Baseline 11/30: CGA, LOB turning to L    Time 4    Period Weeks    Status New    Target Date 04/15/20             PT Long Term Goals - 03/18/20 1647      PT LONG TERM GOAL #1   Title Patient will increase FOTO score to equal to or greater than 71 to demonstrate statistically significant improvement in mobility and quality of life.    Baseline 11/30: 64    Time 8    Period Weeks    Status New    Target Date 05/13/20      PT LONG TERM GOAL #2   Title Patient (< 61 years old) will complete five times sit to stand test in < 10 seconds indicating an increased LE strength and improved balance.    Baseline 11/30:18.35    Time 8    Period Weeks    Status New    Target Date 05/13/20      PT LONG TERM GOAL #3   Title Patient will increase Berg Balance score by > 6 points to demonstrate decreased fall risk during functional activities.    Baseline 11/30: 41/56    Time 8    Period Weeks    Status New    Target Date 05/13/20      PT LONG TERM GOAL #4   Title Patient will increase LLE gross strength to 4+/5 as to improve functional strength for independent gait, increased standing tolerance and increased ADL ability    Baseline 11/30: LLE gross strength 3+/5    Time 8    Period Weeks    Status New    Target Date 05/13/20                 Plan - 04/09/20 1228    Clinical Impression Statement Progressed patient with increased repetitions with core stabilization exercises. Patient tolerated exercises with no change in low back pain. She demonstrates good TA activation with minimal verbal and tactile cues. Patient challenged with progression of core  stabilization exercises with fair demonstration. Therapist provided constant verbal and tactile cues to maintain lumbar spine  in neutral when completing unsupported sitting TA exercises. Patient would benefit from continued PT services in order to increase strength, mobility, and balance to improve patient's quality of life.    Personal Factors and Comorbidities Comorbidity 3+    Comorbidities transverse myelitis, osteoporosis, pernicious anemia, hypoparathyroidism    Examination-Activity Limitations Sit;Stand;Bed Mobility;Stairs    Examination-Participation Restrictions Cleaning;Community Activity    Stability/Clinical Decision Making Evolving/Moderate complexity    Rehab Potential Fair    PT Frequency 2x / week    PT Duration 8 weeks    PT Treatment/Interventions Electrical Stimulation;ADLs/Self Care Home Management;Gait training;Stair training;Functional mobility training;Therapeutic activities;Therapeutic exercise;Balance training;Neuromuscular re-education    PT Next Visit Plan Initiate hip strengthening and balance exercises    PT Home Exercise Plan HEP: SLR, TA activation in supine, bridges, seated marching           Patient will benefit from skilled therapeutic intervention in order to improve the following deficits and impairments:  Decreased activity tolerance,Decreased endurance,Decreased strength,Improper body mechanics,Decreased balance,Decreased mobility,Decreased safety awareness,Difficulty walking,Impaired flexibility  Visit Diagnosis: Unsteadiness on feet  Muscle weakness (generalized)     Problem List Patient Active Problem List   Diagnosis Date Noted  . Personal history of colonic polyps   . Polyp of transverse colon   . Left leg numbness 06/14/2018  . Osteoporosis 06/14/2018    Netta Corrigan 04/09/2020, 12:32 PM  [place] MAIN [facility] SERVICES [address] [place], [place], [address] Phone: [phone]   Fax:   [phone]  Name: Diamone Whistler MRN: [id number] Date of Birth: [birth year]

## 2020-04-14 ENCOUNTER — Ambulatory Visit: Payer: BC Managed Care – PPO | Admitting: Physical Therapy

## 2020-04-14 ENCOUNTER — Other Ambulatory Visit: Payer: Self-pay

## 2020-04-14 DIAGNOSIS — M6281 Muscle weakness (generalized): Secondary | ICD-10-CM | POA: Diagnosis not present

## 2020-04-14 DIAGNOSIS — R2681 Unsteadiness on feet: Secondary | ICD-10-CM

## 2020-04-14 NOTE — Therapy (Signed)
[place] MAIN [facility] SERVICES [address] [place], [place], [address] Phone: [phone]   Fax:  [phone]  Physical Therapy Treatment  Patient Details  Name: Bethany Farmer MRN: [id number] Date of Birth: 04/27/1960 Referring Provider (PT): Dr. Ferrel Logan   Encounter Date: 04/14/2020   PT End of Session - 04/14/20 1312    Visit Number 6    Number of Visits 17    Date for PT Re-Evaluation 05/13/20    Authorization Type PT eval: 03/18/20    PT Start Time 3557    PT Stop Time 1345    PT Time Calculation (min) 40 min    Equipment Utilized During Treatment Gait belt    Activity Tolerance Patient tolerated treatment well;No increased pain    Behavior During Therapy WFL for tasks assessed/performed           Past Medical History:  Diagnosis Date  . Arthritis    hands  . Cancer ([place])   . Transverse myelitis ([place])    questionable in left leg    Past Surgical History:  Procedure Laterality Date  . ABDOMINAL HYSTERECTOMY    . COLONOSCOPY WITH PROPOFOL N/A 09/03/2019   Procedure: COLONOSCOPY WITH PROPOFOL;  Surgeon: Lucilla Lame, MD;  Location: [place];  Service: Endoscopy;  Laterality: N/A;  priority 4  . POLYPECTOMY N/A 09/03/2019   Procedure: POLYPECTOMY;  Surgeon: Lucilla Lame, MD;  Location: [place];  Service: Endoscopy;  Laterality: N/A;  . TONSILLECTOMY      There were no vitals filed for this visit.   Subjective Assessment - 04/14/20 1439    Subjective No new symptoms or falls have been noted since last therapy session.    Pertinent History 59 y.o. female with a history ofmonophasic transverse myelitis, osteoporosis, pernicious anemia, hypoparathyroidism who presents for first-time evaluation at the [facility] for transverse myelitis. She does not meet criteria of MS based on her clinical symptoms, and will need to review her MRI's once we can obtain those imaging. She had one episode of TM that may have been  post-infectious and weakness/sensory symptoms had plateau'd since then. But a few months ago developed new right lower extremity sensory symptoms that were spreading up her right leg slowly-- unclear if this would be consistent with a relapse given the slow progression of the symptoms. We discussed that the right lower extremity symptoms may be related to lumbar radiculopathy and should check an MRI L-spine and repeat MRI T-spine given these new symptoms.    Limitations Standing;Walking;House hold activities;Sitting    How long can you sit comfortably? 20 mins    How long can you stand comfortably? 10 mins    How long can you walk comfortably? 45 mins    Currently in Pain? No/denies          All exercises were completed with 2.5# ankle weights    Supine SLR x 25 reps each leg Supine TA activation with marches x 25 reps Supine dead bugs with TA activation x 20 reps  Supine bridges x 25 reps Supine clamshells with GTB x 20 reps Supine bridge then clamshell with GTB x 25 Sidelying clamshell with GTB holds each side x 3 30s Double knee to chest with blue theraball x 10 10s holds Lower trunk rotation with blue theraball x 10 10s holds  KTOS 30"x3 each Hamstring stretch 90/90 30"x3 each    Seated SAQ x 30 reps Seated marches x 30 reps  PT Short Term Goals - 03/18/20 1647      PT SHORT TERM GOAL #1   Title Patient will be independent in home exercise program to improve strength/mobility for better functional independence with ADLs.    Time 4    Period Weeks    Status New    Target Date 04/15/20      PT SHORT TERM GOAL #2   Title Patient will ascend/descend 4 stairs without rail assist independently without loss of balance to improve ability to get in/out of home.    Baseline 11/30: CGA, LOB turning to L    Time 4    Period Weeks    Status New    Target Date 04/15/20             PT Long Term Goals - 03/18/20 1647      PT LONG TERM GOAL #1   Title Patient  will increase FOTO score to equal to or greater than 71 to demonstrate statistically significant improvement in mobility and quality of life.    Baseline 11/30: 64    Time 8    Period Weeks    Status New    Target Date 05/13/20      PT LONG TERM GOAL #2   Title Patient (< 59 years old) will complete five times sit to stand test in < 10 seconds indicating an increased LE strength and improved balance.    Baseline 11/30:18.35    Time 8    Period Weeks    Status New    Target Date 05/13/20      PT LONG TERM GOAL #3   Title Patient will increase Berg Balance score by > 6 points to demonstrate decreased fall risk during functional activities.    Baseline 11/30: 41/56    Time 8    Period Weeks    Status New    Target Date 05/13/20      PT LONG TERM GOAL #4   Title Patient will increase LLE gross strength to 4+/5 as to improve functional strength for independent gait, increased standing tolerance and increased ADL ability    Baseline 11/30: LLE gross strength 3+/5    Time 8    Period Weeks    Status New    Target Date 05/13/20                 Plan - 04/14/20 1441    Clinical Impression Statement Patient completed core stabilization exercises with minimal to no change in pain level. She demonstrated good TA activation with exercises and has difficulty with dynamic movement with BUE along with TA activation. Patient demonstrates increased muscle fatigue on LLE when completing SLR. She reports that LAQ are becoming less difficulty than previously. Patient will continue to benefit from skilled physical therapy to improve generalized strength, ROM, and capacity for functional activity.    Personal Factors and Comorbidities Comorbidity 3+    Comorbidities transverse myelitis, osteoporosis, pernicious anemia, hypoparathyroidism    Examination-Activity Limitations Sit;Stand;Bed Mobility;Stairs    Examination-Participation Restrictions Cleaning;Community Activity    Stability/Clinical  Decision Making Evolving/Moderate complexity    Rehab Potential Fair    PT Frequency 2x / week    PT Duration 8 weeks    PT Treatment/Interventions Electrical Stimulation;ADLs/Self Care Home Management;Gait training;Stair training;Functional mobility training;Therapeutic activities;Therapeutic exercise;Balance training;Neuromuscular re-education    PT Next Visit Plan Initiate hip strengthening and balance exercises    PT Home Exercise Plan HEP: SLR, TA activation in supine, bridges, seated  marching           Patient will benefit from skilled therapeutic intervention in order to improve the following deficits and impairments:  Decreased activity tolerance,Decreased endurance,Decreased strength,Improper body mechanics,Decreased balance,Decreased mobility,Decreased safety awareness,Difficulty walking,Impaired flexibility  Visit Diagnosis: Unsteadiness on feet  Muscle weakness (generalized)     Problem List Patient Active Problem List   Diagnosis Date Noted  . Personal history of colonic polyps   . Polyp of transverse colon   . Left leg numbness 06/14/2018  . Osteoporosis 06/14/2018   Karl Luke PT, DPT Netta Corrigan 04/14/2020, 2:42 PM  [place] MAIN [facility] SERVICES [address] [place], [place], [address] Phone: [phone]   Fax:  [phone]  Name: Bethany Farmer MRN: [id number] Date of Birth: April 17, 1961

## 2020-04-16 ENCOUNTER — Ambulatory Visit: Payer: BC Managed Care – PPO

## 2020-04-17 ENCOUNTER — Ambulatory Visit: Payer: BC Managed Care – PPO | Admitting: Physical Therapy

## 2020-04-17 ENCOUNTER — Other Ambulatory Visit: Payer: Self-pay

## 2020-04-17 DIAGNOSIS — M6281 Muscle weakness (generalized): Secondary | ICD-10-CM

## 2020-04-17 DIAGNOSIS — R2681 Unsteadiness on feet: Secondary | ICD-10-CM

## 2020-04-17 NOTE — Therapy (Signed)
[place] [facility] MAIN [facility] SERVICES [address] [place], [place], [address] Phone: [phone]   Fax:  [phone]  Physical Therapy Treatment  Patient Details  Name: Bethany Farmer MRN: [id number] Date of Birth: Dec 22, 1960 Referring Provider (PT): Dr. Trisha Mangle   Encounter Date: 04/17/2020   PT End of Session - 04/17/20 1628    Visit Number 7    Number of Visits 17    Date for PT Re-Evaluation 05/13/20    Authorization Type PT eval: 03/18/20    PT Start Time 1600    PT Stop Time 1645    PT Time Calculation (min) 45 min    Equipment Utilized During Treatment Gait belt    Activity Tolerance Patient tolerated treatment well;No increased pain    Behavior During Therapy WFL for tasks assessed/performed           Past Medical History:  Diagnosis Date  . Arthritis    hands  . Cancer (HCC)   . Transverse myelitis (HCC)    questionable in left leg    Past Surgical History:  Procedure Laterality Date  . ABDOMINAL HYSTERECTOMY    . COLONOSCOPY WITH PROPOFOL N/A 09/03/2019   Procedure: COLONOSCOPY WITH PROPOFOL;  Surgeon: Midge Minium, MD;  Location: [facility];  Service: Endoscopy;  Laterality: N/A;  priority 4  . POLYPECTOMY N/A 09/03/2019   Procedure: POLYPECTOMY;  Surgeon: Midge Minium, MD;  Location: [facility];  Service: Endoscopy;  Laterality: N/A;  . TONSILLECTOMY      There were no vitals filed for this visit.   Subjective Assessment - 04/17/20 1626    Subjective Patient states the intensity of her numbness and tingling down her right leg has increased since last therapy session. She states she has a neurologist appointment coming up soon. She denies of any new pain or soreness since last therapy session.    Pertinent History 59 y.o. female with a history ofmonophasic transverse myelitis, osteoporosis, pernicious anemia, hypoparathyroidism who presents for first-time evaluation at the [facility] for transverse myelitis.  She does not meet criteria of MS based on her clinical symptoms, and will need to review her MRI's once we can obtain those imaging. She had one episode of TM that may have been post-infectious and weakness/sensory symptoms had plateau'd since then. But a few months ago developed new right lower extremity sensory symptoms that were spreading up her right leg slowly-- unclear if this would be consistent with a relapse given the slow progression of the symptoms. We discussed that the right lower extremity symptoms may be related to lumbar radiculopathy and should check an MRI L-spine and repeat MRI T-spine given these new symptoms.    Limitations Standing;Walking;House hold activities;Sitting    How long can you sit comfortably? 20 mins    How long can you stand comfortably? 10 mins    How long can you walk comfortably? 45 mins    Currently in Pain? No/denies            Kore Balance:  Tux Racer KoreMaze   NuStep x L1 x 5 minutes  There Ex:  All exercises were completed with 2.5# ankle weight Seated marches x 25 reps each leg  Seated LAQ x 25 reps each leg Standing hip abduction x 20 reps each leg Standing hamstring curls x 20 reps each leg  Glut taps on mat x 10, x 7, x 5 *fatigues with second set and loses minimal balance*   Precor leg press  x BLE x 2 15 reps x 40#        PT Short Term Goals - 03/18/20 1647      PT SHORT TERM GOAL #1   Title Patient will be independent in home exercise program to improve strength/mobility for better functional independence with ADLs.    Time 4    Period Weeks    Status New    Target Date 04/15/20      PT SHORT TERM GOAL #2   Title Patient will ascend/descend 4 stairs without rail assist independently without loss of balance to improve ability to get in/out of home.    Baseline 11/30: CGA, LOB turning to L    Time 4    Period Weeks    Status New    Target Date 04/15/20             PT Long Term Goals - 03/18/20 1647      PT LONG  TERM GOAL #1   Title Patient will increase FOTO score to equal to or greater than 71 to demonstrate statistically significant improvement in mobility and quality of life.    Baseline 11/30: 64    Time 8    Period Weeks    Status New    Target Date 05/13/20      PT LONG TERM GOAL #2   Title Patient (< 60 years old) will complete five times sit to stand test in < 10 seconds indicating an increased LE strength and improved balance.    Baseline 11/30:18.35    Time 8    Period Weeks    Status New    Target Date 05/13/20      PT LONG TERM GOAL #3   Title Patient will increase Berg Balance score by > 6 points to demonstrate decreased fall risk during functional activities.    Baseline 11/30: 41/56    Time 8    Period Weeks    Status New    Target Date 05/13/20      PT LONG TERM GOAL #4   Title Patient will increase LLE gross strength to 4+/5 as to improve functional strength for independent gait, increased standing tolerance and increased ADL ability    Baseline 11/30: LLE gross strength 3+/5    Time 8    Period Weeks    Status New    Target Date 05/13/20                 Plan - 04/17/20 1745    Clinical Impression Statement Progressed patient's balance exercises with Kore Balance exercises today. He completed unsupported standing balance exercises with progression to improve weight shifting and motor control with various games with cues and repetition. Introduced hip strengthening exercises in weight bearing position requiring verbal cues for proper hip and knee positioning to complete exercise.Patient will continue to benefit from skilled physical therapy to improve generalized strength, ROM, and capacity for functional activity.    Personal Factors and Comorbidities Comorbidity 3+    Comorbidities transverse myelitis, osteoporosis, pernicious anemia, hypoparathyroidism    Examination-Activity Limitations Sit;Stand;Bed Mobility;Stairs    Examination-Participation Restrictions  Cleaning;Community Activity    Stability/Clinical Decision Making Evolving/Moderate complexity    Rehab Potential Fair    PT Frequency 2x / week    PT Duration 8 weeks    PT Treatment/Interventions Electrical Stimulation;ADLs/Self Care Home Management;Gait training;Stair training;Functional mobility training;Therapeutic activities;Therapeutic exercise;Balance training;Neuromuscular re-education    PT Next Visit Plan Initiate hip strengthening and balance exercises  PT Home Exercise Plan HEP: SLR, TA activation in supine, bridges, seated marching           Patient will benefit from skilled therapeutic intervention in order to improve the following deficits and impairments:  Decreased activity tolerance,Decreased endurance,Decreased strength,Improper body mechanics,Decreased balance,Decreased mobility,Decreased safety awareness,Difficulty walking,Impaired flexibility  Visit Diagnosis: Unsteadiness on feet  Muscle weakness (generalized)     Problem List Patient Active Problem List   Diagnosis Date Noted  . Personal history of colonic polyps   . Polyp of transverse colon   . Left leg numbness 06/14/2018  . Osteoporosis 06/14/2018   Karl Luke PT, DPT Netta Corrigan 04/17/2020, 5:54 PM  [place] MAIN [facility] SERVICES [address] [place], [place], [address] Phone: [phone]   Fax:  [phone]  Name: Bethany Farmer MRN: [id number] Date of Birth: January 25, 1961

## 2020-04-21 ENCOUNTER — Ambulatory Visit: Payer: BC Managed Care – PPO

## 2020-04-22 ENCOUNTER — Ambulatory Visit: Payer: BC Managed Care – PPO | Attending: Student | Admitting: Physical Therapy

## 2020-04-22 ENCOUNTER — Other Ambulatory Visit: Payer: Self-pay

## 2020-04-22 DIAGNOSIS — R2681 Unsteadiness on feet: Secondary | ICD-10-CM | POA: Insufficient documentation

## 2020-04-22 DIAGNOSIS — M6281 Muscle weakness (generalized): Secondary | ICD-10-CM | POA: Diagnosis present

## 2020-04-22 NOTE — Therapy (Signed)
[place] [facility] MAIN [facility] SERVICES [address] [place], [place], [address] Phone: [phone]   Fax:  [phone]  Physical Therapy Treatment  Patient Details  Name: Bethany Farmer MRN: [id number] Date of Birth: 1960-12-31 Referring Provider (PT): Dr. Trisha Mangle   Encounter Date: 04/22/2020   PT End of Session - 04/22/20 1454    Visit Number 8    Number of Visits 17    Date for PT Re-Evaluation 05/13/20    Authorization Type PT eval: 03/18/20    PT Start Time 1450    PT Stop Time 1530    PT Time Calculation (min) 40 min    Equipment Utilized During Treatment Gait belt    Activity Tolerance Patient tolerated treatment well;No increased pain    Behavior During Therapy WFL for tasks assessed/performed           Past Medical History:  Diagnosis Date  . Arthritis    hands  . Cancer (HCC)   . Transverse myelitis (HCC)    questionable in left leg    Past Surgical History:  Procedure Laterality Date  . ABDOMINAL HYSTERECTOMY    . COLONOSCOPY WITH PROPOFOL N/A 09/03/2019   Procedure: COLONOSCOPY WITH PROPOFOL;  Surgeon: Midge Minium, MD;  Location: [facility];  Service: Endoscopy;  Laterality: N/A;  priority 4  . POLYPECTOMY N/A 09/03/2019   Procedure: POLYPECTOMY;  Surgeon: Midge Minium, MD;  Location: [facility];  Service: Endoscopy;  Laterality: N/A;  . TONSILLECTOMY      There were no vitals filed for this visit.   Subjective Assessment - 04/22/20 1455    Subjective Patient states she is compliant with her HEP when she can remember doing them. She denies of any falls or injuries since last therapy session.    Pertinent History 60 y.o. female with a history ofmonophasic transverse myelitis, osteoporosis, pernicious anemia, hypoparathyroidism who presents for first-time evaluation at the [facility] for transverse myelitis. She does not meet criteria of MS based on her clinical symptoms, and will need to review her MRI's once  we can obtain those imaging. She had one episode of TM that may have been post-infectious and weakness/sensory symptoms had plateau'd since then. But a few months ago developed new right lower extremity sensory symptoms that were spreading up her right leg slowly-- unclear if this would be consistent with a relapse given the slow progression of the symptoms. We discussed that the right lower extremity symptoms may be related to lumbar radiculopathy and should check an MRI L-spine and repeat MRI T-spine given these new symptoms.    Limitations Standing;Walking;House hold activities;Sitting    How long can you sit comfortably? 20 mins    How long can you stand comfortably? 10 mins    How long can you walk comfortably? 45 mins    Currently in Pain? No/denies            NuStep x L1 x 5 minutes  Kore Balance:  Tux Racer x 3 KoreMaze x 1 Scale x 5 Popdot x 3 SDL Ball x up to Level 3   Glut taps on mat x 11, x 9, x 5   Precor leg press x BLE x 2 15 reps x 40# Precor heel raises x BLE x 2 15 reps x 40#      PT Short Term Goals - 03/18/20 1647      PT SHORT TERM GOAL #1   Title Patient will be independent in home  exercise program to improve strength/mobility for better functional independence with ADLs.    Time 4    Period Weeks    Status New    Target Date 04/15/20      PT SHORT TERM GOAL #2   Title Patient will ascend/descend 4 stairs without rail assist independently without loss of balance to improve ability to get in/out of home.    Baseline 11/30: CGA, LOB turning to L    Time 4    Period Weeks    Status New    Target Date 04/15/20             PT Long Term Goals - 03/18/20 1647      PT LONG TERM GOAL #1   Title Patient will increase FOTO score to equal to or greater than 71 to demonstrate statistically significant improvement in mobility and quality of life.    Baseline 11/30: 64    Time 8    Period Weeks    Status New    Target Date 05/13/20      PT LONG  TERM GOAL #2   Title Patient (< 52 years old) will complete five times sit to stand test in < 10 seconds indicating an increased LE strength and improved balance.    Baseline 11/30:18.35    Time 8    Period Weeks    Status New    Target Date 05/13/20      PT LONG TERM GOAL #3   Title Patient will increase Berg Balance score by > 6 points to demonstrate decreased fall risk during functional activities.    Baseline 11/30: 41/56    Time 8    Period Weeks    Status New    Target Date 05/13/20      PT LONG TERM GOAL #4   Title Patient will increase LLE gross strength to 4+/5 as to improve functional strength for independent gait, increased standing tolerance and increased ADL ability    Baseline 11/30: LLE gross strength 3+/5    Time 8    Period Weeks    Status New    Target Date 05/13/20                 Plan - 04/22/20 1634    Clinical Impression Statement Patient demonstrated increased tolerance to KoreBalance activities and games to improve weight shifting and maintaining eccentric quad control. Patient reports of increased intensity of numbness and tingling in feet with leg press but is resolved with seated rest break. Patient demonstrates improve body mechanics while completing strengthening exercises with minimal to no verbal cues. Patient would benefit from continued PT services to increase strength and mobility to improve patient's quality of life.    Personal Factors and Comorbidities Comorbidity 3+    Comorbidities transverse myelitis, osteoporosis, pernicious anemia, hypoparathyroidism    Examination-Activity Limitations Sit;Stand;Bed Mobility;Stairs    Examination-Participation Restrictions Cleaning;Community Activity    Stability/Clinical Decision Making Evolving/Moderate complexity    Rehab Potential Fair    PT Frequency 2x / week    PT Duration 8 weeks    PT Treatment/Interventions Electrical Stimulation;ADLs/Self Care Home Management;Gait training;Stair  training;Functional mobility training;Therapeutic activities;Therapeutic exercise;Balance training;Neuromuscular re-education    PT Next Visit Plan Initiate hip strengthening and balance exercises    PT Home Exercise Plan HEP: SLR, TA activation in supine, bridges, seated marching           Patient will benefit from skilled therapeutic intervention in order to improve the following deficits  and impairments:  Decreased activity tolerance,Decreased endurance,Decreased strength,Improper body mechanics,Decreased balance,Decreased mobility,Decreased safety awareness,Difficulty walking,Impaired flexibility  Visit Diagnosis: Unsteadiness on feet  Muscle weakness (generalized)     Problem List Patient Active Problem List   Diagnosis Date Noted  . Personal history of colonic polyps   . Polyp of transverse colon   . Left leg numbness 06/14/2018  . Osteoporosis 06/14/2018   Jillyn Hidden PT, DPT Amelia Jo 04/22/2020, 4:37 PM  [place] [facility] MAIN [facility] SERVICES [address] [place], [place], [address] Phone: [phone]   Fax:  [phone]  Name: Bethany Farmer MRN: [id number] Date of Birth: [birth year]

## 2020-04-23 ENCOUNTER — Ambulatory Visit: Payer: BC Managed Care – PPO

## 2020-04-25 ENCOUNTER — Ambulatory Visit: Payer: BC Managed Care – PPO

## 2020-04-28 ENCOUNTER — Ambulatory Visit: Payer: BC Managed Care – PPO

## 2020-04-29 ENCOUNTER — Ambulatory Visit: Payer: BC Managed Care – PPO

## 2020-04-29 ENCOUNTER — Other Ambulatory Visit: Payer: Self-pay

## 2020-04-29 DIAGNOSIS — R2681 Unsteadiness on feet: Secondary | ICD-10-CM

## 2020-04-29 DIAGNOSIS — M6281 Muscle weakness (generalized): Secondary | ICD-10-CM

## 2020-04-29 NOTE — Therapy (Signed)
[place] MAIN [facility] SERVICES [address] [place], [place], [address] Phone: [phone]   Fax:  [phone]  Physical Therapy Treatment  Patient Details  Name: Bethany Farmer MRN: [id number] Date of Birth: 1960-12-26 Referring Provider (PT): Dr. Ferrel Logan   Encounter Date: 04/29/2020   PT End of Session - 04/29/20 1654    Visit Number 9    Number of Visits 17    Date for PT Re-Evaluation 05/13/20    Authorization Type PT eval: 03/18/20    PT Start Time 6962    PT Stop Time 1645    PT Time Calculation (min) 41 min    Activity Tolerance Patient tolerated treatment well;No increased pain    Behavior During Therapy WFL for tasks assessed/performed           Past Medical History:  Diagnosis Date  . Arthritis    hands  . Cancer ([place])   . Transverse myelitis ([place])    questionable in left leg    Past Surgical History:  Procedure Laterality Date  . ABDOMINAL HYSTERECTOMY    . COLONOSCOPY WITH PROPOFOL N/A 09/03/2019   Procedure: COLONOSCOPY WITH PROPOFOL;  Surgeon: Lucilla Lame, MD;  Location: [place];  Service: Endoscopy;  Laterality: N/A;  priority 4  . POLYPECTOMY N/A 09/03/2019   Procedure: POLYPECTOMY;  Surgeon: Lucilla Lame, MD;  Location: [place];  Service: Endoscopy;  Laterality: N/A;  . TONSILLECTOMY      There were no vitals filed for this visit.   Subjective Assessment - 04/29/20 1652    Subjective Pt reports no pain today. Pt reports doing most of her HEP.    Pertinent History 60 y.o. female with a history ofmonophasic transverse myelitis, osteoporosis, pernicious anemia, hypoparathyroidism who presents for first-time evaluation at the [facility] for transverse myelitis. She does not meet criteria of MS based on her clinical symptoms, and will need to review her MRI's once we can obtain those imaging. She had one episode of TM that may have been post-infectious and weakness/sensory symptoms had plateau'd  since then. But a few months ago developed new right lower extremity sensory symptoms that were spreading up her right leg slowly-- unclear if this would be consistent with a relapse given the slow progression of the symptoms. We discussed that the right lower extremity symptoms may be related to lumbar radiculopathy and should check an MRI L-spine and repeat MRI T-spine given these new symptoms.    Limitations Standing;Walking;House hold activities;Sitting    How long can you sit comfortably? 20 mins    How long can you stand comfortably? 10 mins    How long can you walk comfortably? 45 mins    Currently in Pain? No/denies            NuStep x L1 x 5 minutes Precor leg press x BLE x 1 15 reps x 40#, 1x15 45# - pt rates increased weight "medium" Precor heel raises x BLE x 2 x 15 reps x 40# Pt rates exercise "medium" difficulty Pt with no reports of sx on leg press today  All exercises were completed with 2.5# ankle weight Seated marches 1 x 25 reps each leg VC to increase AROM L LE Seated LAQ x 25 reps each leg Standing hip abduction x 20 reps each leg   Kore Balance:  Tux Racer x 4, transition from unilateral UE support first two rounds to intermittent UE support last two rounds KoreMaze x 3, intermittent UE  support and CGA to help pt maintain/regain balance       PT Education - 04/29/20 1653    Education Details PT educated pt on correct technique with standing hip abduction exercise.    Person(s) Educated Patient    Methods Explanation;Demonstration;Verbal cues    Comprehension Verbalized understanding;Returned demonstration            PT Short Term Goals - 03/18/20 1647      PT SHORT TERM GOAL #1   Title Patient will be independent in home exercise program to improve strength/mobility for better functional independence with ADLs.    Time 4    Period Weeks    Status New    Target Date 04/15/20      PT SHORT TERM GOAL #2   Title Patient will ascend/descend 4 stairs  without rail assist independently without loss of balance to improve ability to get in/out of home.    Baseline 11/30: CGA, LOB turning to L    Time 4    Period Weeks    Status New    Target Date 04/15/20             PT Long Term Goals - 03/18/20 1647      PT LONG TERM GOAL #1   Title Patient will increase FOTO score to equal to or greater than 71 to demonstrate statistically significant improvement in mobility and quality of life.    Baseline 11/30: 64    Time 8    Period Weeks    Status New    Target Date 05/13/20      PT LONG TERM GOAL #2   Title Patient (< 35 years old) will complete five times sit to stand test in < 10 seconds indicating an increased LE strength and improved balance.    Baseline 11/30:18.35    Time 8    Period Weeks    Status New    Target Date 05/13/20      PT LONG TERM GOAL #3   Title Patient will increase Berg Balance score by > 6 points to demonstrate decreased fall risk during functional activities.    Baseline 11/30: 41/56    Time 8    Period Weeks    Status New    Target Date 05/13/20      PT LONG TERM GOAL #4   Title Patient will increase LLE gross strength to 4+/5 as to improve functional strength for independent gait, increased standing tolerance and increased ADL ability    Baseline 11/30: LLE gross strength 3+/5    Time 8    Period Weeks    Status New    Target Date 05/13/20                 Plan - 04/29/20 1656    Clinical Impression Statement Pt reports no sx today with exercises. Pt able to increase weight used on leg press, indicating increased LE strength. Pt greatest difficulty with reactive postural control when going outside of her BOS posteriorly on Kore Balance, requiring B UE support and CGA from PT to regain balance. Pt will benefit from further skilled therapy to increase B LE strength and balance in order to improve QOL and decrease risk of falls.    Personal Factors and Comorbidities Comorbidity 3+     Comorbidities transverse myelitis, osteoporosis, pernicious anemia, hypoparathyroidism    Examination-Activity Limitations Sit;Stand;Bed Mobility;Stairs    Examination-Participation Restrictions Cleaning;Community Activity    Stability/Clinical Decision Making Evolving/Moderate complexity  Rehab Potential Fair    PT Frequency 2x / week    PT Duration 8 weeks    PT Treatment/Interventions Electrical Stimulation;ADLs/Self Care Home Management;Gait training;Stair training;Functional mobility training;Therapeutic activities;Therapeutic exercise;Balance training;Neuromuscular re-education    PT Next Visit Plan progress hip strengthening and balance exercises    PT Home Exercise Plan HEP: SLR, TA activation in supine, bridges, seated marching           Patient will benefit from skilled therapeutic intervention in order to improve the following deficits and impairments:  Decreased activity tolerance,Decreased endurance,Decreased strength,Improper body mechanics,Decreased balance,Decreased mobility,Decreased safety awareness,Difficulty walking,Impaired flexibility  Visit Diagnosis: Unsteadiness on feet  Muscle weakness (generalized)     Problem List Patient Active Problem List   Diagnosis Date Noted  . Personal history of colonic polyps   . Polyp of transverse colon   . Left leg numbness 06/14/2018  . Osteoporosis 06/14/2018    Ricard Dillon PT, DPT 04/29/2020, 5:10 PM  [place] MAIN [facility] SERVICES [address] [place], [place], [address] Phone: [phone]   Fax:  [phone]  Name: Bethany Farmer MRN: [id number] Date of Birth: 12-11-1960

## 2020-04-30 ENCOUNTER — Ambulatory Visit: Payer: BC Managed Care – PPO

## 2020-05-02 ENCOUNTER — Ambulatory Visit: Payer: BC Managed Care – PPO

## 2020-05-05 ENCOUNTER — Ambulatory Visit: Payer: BC Managed Care – PPO

## 2020-05-06 ENCOUNTER — Ambulatory Visit: Payer: BC Managed Care – PPO | Admitting: Physical Therapy

## 2020-05-06 DIAGNOSIS — M6281 Muscle weakness (generalized): Secondary | ICD-10-CM

## 2020-05-06 DIAGNOSIS — R2681 Unsteadiness on feet: Secondary | ICD-10-CM

## 2020-05-06 NOTE — Therapy (Signed)
[place] MAIN [facility] SERVICES [address] [place], [place], [address] Phone: [phone]   Fax:  [phone]  Physical Therapy Treatment/Progress Note Dates of reporting period  03/18/2020   to   05/06/2020  Patient Details  Name: Bethany Farmer MRN: [id number] Date of Birth: 08-30-1960 Referring Provider (PT): Dr. Ferrel Logan   Encounter Date: 05/06/2020   PT End of Session - 05/06/20 1639    Visit Number 10    Number of Visits 17    Date for PT Re-Evaluation 05/13/20    Authorization Type PT eval: 03/18/20    PT Start Time 1645    PT Stop Time 1730    PT Time Calculation (min) 45 min    Activity Tolerance Patient tolerated treatment well;No increased pain    Behavior During Therapy WFL for tasks assessed/performed           Past Medical History:  Diagnosis Date  . Arthritis    hands  . Cancer ([place])   . Transverse myelitis ([place])    questionable in left leg    Past Surgical History:  Procedure Laterality Date  . ABDOMINAL HYSTERECTOMY    . COLONOSCOPY WITH PROPOFOL N/A 09/03/2019   Procedure: COLONOSCOPY WITH PROPOFOL;  Surgeon: Lucilla Lame, MD;  Location: [place];  Service: Endoscopy;  Laterality: N/A;  priority 4  . POLYPECTOMY N/A 09/03/2019   Procedure: POLYPECTOMY;  Surgeon: Lucilla Lame, MD;  Location: [place];  Service: Endoscopy;  Laterality: N/A;  . TONSILLECTOMY      There were no vitals filed for this visit.   Subjective Assessment - 05/06/20 1646    Subjective Patient reports that her numbness/tingling in her RLE have worsened. She states her balance has gotten worse at home with turning to the left. She denies of any falls or injuries since last therpay session.    Pertinent History 60 y.o. female with a history ofmonophasic transverse myelitis, osteoporosis, pernicious anemia, hypoparathyroidism who presents for first-time evaluation at the [facility] for transverse myelitis. She does not meet  criteria of MS based on her clinical symptoms, and will need to review her MRI's once we can obtain those imaging. She had one episode of TM that may have been post-infectious and weakness/sensory symptoms had plateau'd since then. But a few months ago developed new right lower extremity sensory symptoms that were spreading up her right leg slowly-- unclear if this would be consistent with a relapse given the slow progression of the symptoms. We discussed that the right lower extremity symptoms may be related to lumbar radiculopathy and should check an MRI L-spine and repeat MRI T-spine given these new symptoms.    Limitations Standing;Walking;House hold activities;Sitting    How long can you sit comfortably? 20 mins    How long can you stand comfortably? 10 mins    How long can you walk comfortably? 45 mins    Currently in Pain? No/denies          There Ex: Supine bridges x 20 reps TA activation x marching x 20 reps  SLR x 2.5# AW x 30 reps each leg Seated LAQ x 2.5# AW x 30 reps each leg  Clinical Impression: Updated patient's goals in today's therapy session. Patient demonstrates significant progress with her balance and strength as seen in improvement with outcome measures. Patient is at a decreased risk for falls as seen in increased score of Berg Balance Scale from 41/56 to 50/56. Patient demonstrates significant improvement  in LLE strength as evidenced by increasing MMT score by 1/2 grade. Patient will continue to benefit from skilled physical therapy to improve generalized strength, ROM, and capacity for functional activity.       PT Short Term Goals - 05/06/20 1648      PT SHORT TERM GOAL #1   Title Patient will be independent in home exercise program to improve strength/mobility for better functional independence with ADLs.    Baseline 01/18: patient reports of not being consistent with them    Time 4    Period Weeks    Status Partially Met    Target Date 04/15/20      PT  SHORT TERM GOAL #2   Title Patient will ascend/descend 4 stairs without rail assist independently without loss of balance to improve ability to get in/out of home.    Baseline 11/30: CGA, LOB turning to L, 01/18: independent, no LOB noted    Time 4    Period Weeks    Status Achieved    Target Date 04/15/20             PT Long Term Goals - 05/06/20 1651      PT LONG TERM GOAL #1   Title Patient will increase FOTO score to equal to or greater than 71 to demonstrate statistically significant improvement in mobility and quality of life.    Baseline 11/30: 64    Time 8    Period Weeks    Status New    Target Date 05/13/20      PT LONG TERM GOAL #2   Title Patient (< 89 years old) will complete five times sit to stand test in < 10 seconds indicating an increased LE strength and improved balance.    Baseline 11/30:18.35, 01/18: 15.10s    Time 8    Period Weeks    Status Partially Met    Target Date 05/13/20      PT LONG TERM GOAL #3   Title Patient will increase Berg Balance score by > 6 points to demonstrate decreased fall risk during functional activities.    Baseline 11/30: 41/56, 01/18: 50/56    Time 8    Period Weeks    Status Achieved    Target Date 05/13/20      PT LONG TERM GOAL #4   Title Patient will increase LLE gross strength to 4+/5 as to improve functional strength for independent gait, increased standing tolerance and increased ADL ability    Baseline 11/30: LLE gross strength 3+/5, 01/18: 4/5    Time 8    Period Weeks    Status Partially Met    Target Date 05/13/20                 Plan - 05/06/20 1706    Clinical Impression Statement Updated patient's goals in today's therapy session. Patient demonstrates significant progress with her balance and strength as seen in improvement with outcome measures. Patient is at a decreased risk for falls as seen in increased score of Berg Balance Scale from 41/56 to 50/56. Patient demonstrates significant  improvement in LLE strength as evidenced by increasing MMT score by 1/2 grade. Patient will continue to benefit from skilled physical therapy to improve generalized strength, ROM, and capacity for functional activity.    Personal Factors and Comorbidities Comorbidity 3+    Comorbidities transverse myelitis, osteoporosis, pernicious anemia, hypoparathyroidism    Examination-Activity Limitations Sit;Stand;Bed Mobility;Stairs    Examination-Participation Restrictions Cleaning;Community Activity  Stability/Clinical Decision Making Evolving/Moderate complexity    Rehab Potential Fair    PT Frequency 2x / week    PT Duration 8 weeks    PT Treatment/Interventions Electrical Stimulation;ADLs/Self Care Home Management;Gait training;Stair training;Functional mobility training;Therapeutic activities;Therapeutic exercise;Balance training;Neuromuscular re-education    PT Next Visit Plan progress hip strengthening and balance exercises    PT Home Exercise Plan HEP: SLR, TA activation in supine, bridges, seated marching           Patient will benefit from skilled therapeutic intervention in order to improve the following deficits and impairments:  Decreased activity tolerance,Decreased endurance,Decreased strength,Improper body mechanics,Decreased balance,Decreased mobility,Decreased safety awareness,Difficulty walking,Impaired flexibility  Visit Diagnosis: Muscle weakness (generalized)  Unsteadiness on feet     Problem List Patient Active Problem List   Diagnosis Date Noted  . Personal history of colonic polyps   . Polyp of transverse colon   . Left leg numbness 06/14/2018  . Osteoporosis 06/14/2018   Karl Luke PT, DPT Netta Corrigan 05/06/2020, 5:55 PM  [place] MAIN [facility] SERVICES [address] [place], [place], [address] Phone: [phone]   Fax:  [phone]  Name: Sadiya Durand MRN: [id number] Date of Birth: June 15, 1960

## 2020-05-07 ENCOUNTER — Ambulatory Visit: Payer: BC Managed Care – PPO

## 2020-05-09 ENCOUNTER — Ambulatory Visit: Payer: BC Managed Care – PPO

## 2020-05-09 DIAGNOSIS — R2681 Unsteadiness on feet: Secondary | ICD-10-CM | POA: Diagnosis not present

## 2020-05-09 DIAGNOSIS — M6281 Muscle weakness (generalized): Secondary | ICD-10-CM

## 2020-05-09 NOTE — Therapy (Signed)
[place] MAIN [facility] SERVICES [address] [place], [place], [address] Phone: [phone]   Fax:  [phone]  Physical Therapy Treatment  Patient Details  Name: Bethany Farmer MRN: [id number] Date of Birth: Jan 24, 1961 Referring Provider (PT): Dr. Ferrel Logan   Encounter Date: 05/09/2020   PT End of Session - 05/09/20 1028    Visit Number 11    Number of Visits 17    Date for PT Re-Evaluation 05/13/20    Authorization Type PT eval: 03/18/20    PT Start Time 1025    PT Stop Time 1112    PT Time Calculation (min) 47 min    Equipment Utilized During Treatment Gait belt    Activity Tolerance Patient tolerated treatment well;No increased pain    Behavior During Therapy WFL for tasks assessed/performed           Past Medical History:  Diagnosis Date   Arthritis    hands   Cancer ([place])    Transverse myelitis ([place])    questionable in left leg    Past Surgical History:  Procedure Laterality Date   ABDOMINAL HYSTERECTOMY     COLONOSCOPY WITH PROPOFOL N/A 09/03/2019   Procedure: COLONOSCOPY WITH PROPOFOL;  Surgeon: Lucilla Lame, MD;  Location: [place];  Service: Endoscopy;  Laterality: N/A;  priority 4   POLYPECTOMY N/A 09/03/2019   Procedure: POLYPECTOMY;  Surgeon: Lucilla Lame, MD;  Location: [place];  Service: Endoscopy;  Laterality: N/A;   TONSILLECTOMY      There were no vitals filed for this visit.   Subjective Assessment - 05/09/20 1027    Subjective The patient reports that she is doing well states she notices balance becomes an issue when she is walking and turns to talk to somebody.    Pertinent History 60 y.o. female with a history ofmonophasic transverse myelitis, osteoporosis, pernicious anemia, hypoparathyroidism who presents for first-time evaluation at the [facility] for transverse myelitis. She does not meet criteria of MS based on her clinical symptoms, and will need to review her MRI's once we  can obtain those imaging. She had one episode of TM that may have been post-infectious and weakness/sensory symptoms had plateau'd since then. But a few months ago developed new right lower extremity sensory symptoms that were spreading up her right leg slowly-- unclear if this would be consistent with a relapse given the slow progression of the symptoms. We discussed that the right lower extremity symptoms may be related to lumbar radiculopathy and should check an MRI L-spine and repeat MRI T-spine given these new symptoms.    Limitations Standing;Walking;House hold activities;Sitting    How long can you sit comfortably? 20 mins    How long can you stand comfortably? 10 mins    How long can you walk comfortably? 45 mins    Currently in Pain? No/denies          NuStep x L1 x 5 minutes Precor leg press x BLE 2x15 45# Precor heel raises x BLE x 2 x 15 reps x 40# Pt rates exercise medium difficulty  All exercises were completed with 2.5# ankle weight  Seated LAQ x 25 reps each leg Standing hip abduction x 20 reps each leg Standing marching x20 reps each leg Standing hamstring curls x20 reps each leg  Walking f/b VOR x1 2 laps eyes focus on X with self selected pace of head turns  Sit to stand to Airex with head turns in standing x10 Airex  NBOS with head turns fast/slow EO/EC x2 mins total   Kore Balance:  Tux Racer x 1 bilateral UE support KoreMaze x 3, intermittent UE support and CGA to help pt maintain/regain balance SDL x2 UE support                               PT Education - 05/09/20 1028    Education Details balance    Person(s) Educated Patient    Methods Explanation    Comprehension Verbalized understanding            PT Short Term Goals - 05/06/20 1648      PT SHORT TERM GOAL #1   Title Patient will be independent in home exercise program to improve strength/mobility for better functional independence with ADLs.    Baseline 01/18:  patient reports of not being consistent with them    Time 4    Period Weeks    Status Partially Met    Target Date 04/15/20      PT SHORT TERM GOAL #2   Title Patient will ascend/descend 4 stairs without rail assist independently without loss of balance to improve ability to get in/out of home.    Baseline 11/30: CGA, LOB turning to L, 01/18: independent, no LOB noted    Time 4    Period Weeks    Status Achieved    Target Date 04/15/20             PT Long Term Goals - 05/06/20 1651      PT LONG TERM GOAL #1   Title Patient will increase FOTO score to equal to or greater than 71 to demonstrate statistically significant improvement in mobility and quality of life.    Baseline 11/30: 64    Time 8    Period Weeks    Status New    Target Date 05/13/20      PT LONG TERM GOAL #2   Title Patient (< 16 years old) will complete five times sit to stand test in < 10 seconds indicating an increased LE strength and improved balance.    Baseline 11/30:18.35, 01/18: 15.10s    Time 8    Period Weeks    Status Partially Met    Target Date 05/13/20      PT LONG TERM GOAL #3   Title Patient will increase Berg Balance score by > 6 points to demonstrate decreased fall risk during functional activities.    Baseline 11/30: 41/56, 01/18: 50/56    Time 8    Period Weeks    Status Achieved    Target Date 05/13/20      PT LONG TERM GOAL #4   Title Patient will increase LLE gross strength to 4+/5 as to improve functional strength for independent gait, increased standing tolerance and increased ADL ability    Baseline 11/30: LLE gross strength 3+/5, 01/18: 4/5    Time 8    Period Weeks    Status Partially Met    Target Date 05/13/20                 Plan - 05/09/20 1120    Clinical Impression Statement Patient challenged with stability on uneven surfaces with head turns and demonstrated some decreased balance with head turns while walking.  Patient overall continues to demonstrate  improvements overall and per patient may decide her next visit to be her last.  The patient continues to  benefit from skilled PT services to further improve balance and strength for improved quality of life.    Personal Factors and Comorbidities Comorbidity 3+    Comorbidities transverse myelitis, osteoporosis, pernicious anemia, hypoparathyroidism    Examination-Activity Limitations Sit;Stand;Bed Mobility;Stairs    Examination-Participation Restrictions Cleaning;Community Activity    Stability/Clinical Decision Making Evolving/Moderate complexity    Rehab Potential Fair    PT Frequency 2x / week    PT Duration 8 weeks    PT Treatment/Interventions Electrical Stimulation;ADLs/Self Care Home Management;Gait training;Stair training;Functional mobility training;Therapeutic activities;Therapeutic exercise;Balance training;Neuromuscular re-education    PT Next Visit Plan progress hip strengthening and balance exercises    PT Home Exercise Plan HEP: SLR, TA activation in supine, bridges, seated marching           Patient will benefit from skilled therapeutic intervention in order to improve the following deficits and impairments:  Decreased activity tolerance,Decreased endurance,Decreased strength,Improper body mechanics,Decreased balance,Decreased mobility,Decreased safety awareness,Difficulty walking,Impaired flexibility  Visit Diagnosis: Muscle weakness (generalized)  Unsteadiness on feet     Problem List Patient Active Problem List   Diagnosis Date Noted   Personal history of colonic polyps    Polyp of transverse colon    Left leg numbness 06/14/2018   Osteoporosis 06/14/2018    Hal Morales PT, DPT 05/09/2020, 11:27 AM  [place] MAIN [facility] SERVICES [address] [place], [place], [address] Phone: [phone]   Fax:  [phone]  Name: Bethany Farmer MRN: [id number] Date of Birth: 08-Dec-1960

## 2020-05-12 ENCOUNTER — Ambulatory Visit: Payer: BC Managed Care – PPO

## 2020-05-13 ENCOUNTER — Ambulatory Visit: Payer: BC Managed Care – PPO | Admitting: Physical Therapy

## 2020-05-13 ENCOUNTER — Other Ambulatory Visit: Payer: Self-pay

## 2020-05-13 DIAGNOSIS — M6281 Muscle weakness (generalized): Secondary | ICD-10-CM

## 2020-05-13 DIAGNOSIS — R2681 Unsteadiness on feet: Secondary | ICD-10-CM

## 2020-05-13 NOTE — Therapy (Signed)
[place] MAIN [facility] SERVICES [address] [place], [place], [address] Phone: [phone]   Fax:  [phone]  Physical Therapy Treatment/Discharge Summary  Patient Details  Name: Bethany Farmer MRN: [id number] Date of Birth: 1960/04/23 Referring Provider (PT): Dr. Ferrel Logan   Encounter Date: 05/13/2020   PT End of Session - 05/13/20 1801    Visit Number 12    Number of Visits 17    Date for PT Re-Evaluation 05/13/20    Authorization Type PT eval: 03/18/20    PT Start Time 1645    PT Stop Time 1720    PT Time Calculation (min) 35 min    Equipment Utilized During Treatment Gait belt    Activity Tolerance Patient tolerated treatment well;No increased pain    Behavior During Therapy WFL for tasks assessed/performed           Past Medical History:  Diagnosis Date  . Arthritis    hands  . Cancer ([place])   . Transverse myelitis ([place])    questionable in left leg    Past Surgical History:  Procedure Laterality Date  . ABDOMINAL HYSTERECTOMY    . COLONOSCOPY WITH PROPOFOL N/A 09/03/2019   Procedure: COLONOSCOPY WITH PROPOFOL;  Surgeon: Lucilla Lame, MD;  Location: [place];  Service: Endoscopy;  Laterality: N/A;  priority 4  . POLYPECTOMY N/A 09/03/2019   Procedure: POLYPECTOMY;  Surgeon: Lucilla Lame, MD;  Location: [place];  Service: Endoscopy;  Laterality: N/A;  . TONSILLECTOMY      There were no vitals filed for this visit.   Subjective Assessment - 05/13/20 1759    Subjective Patient reports that her R side of her lower back started to have increased sharpness and burning pain similar to her L side. She reports that it went away this morning but she is considered her myelitis is spreading. She states her follow up with her specialist for her transvere myelitis is on 06/04/20. She will monitor the pain and will decide if she needs to make an earlier appointment for it.    Pertinent History 60 y.o. female with a history  ofmonophasic transverse myelitis, osteoporosis, pernicious anemia, hypoparathyroidism who presents for first-time evaluation at the [facility] for transverse myelitis. She does not meet criteria of MS based on her clinical symptoms, and will need to review her MRI's once we can obtain those imaging. She had one episode of TM that may have been post-infectious and weakness/sensory symptoms had plateau'd since then. But a few months ago developed new right lower extremity sensory symptoms that were spreading up her right leg slowly-- unclear if this would be consistent with a relapse given the slow progression of the symptoms. We discussed that the right lower extremity symptoms may be related to lumbar radiculopathy and should check an MRI L-spine and repeat MRI T-spine given these new symptoms.    Limitations Standing;Walking;House hold activities;Sitting    How long can you sit comfortably? 20 mins    How long can you stand comfortably? 10 mins    How long can you walk comfortably? 45 mins    Currently in Pain? Yes    Pain Score 3     Pain Location Back    Pain Orientation Right;Left    Pain Descriptors / Indicators Aching;Burning;Stabbing;Sharp    Pain Type Chronic pain                 Clinical Impression:   Patient has reached her maximum rehab  potential at this time with physical therapy as evidenced by partially meeting and achieving her short term and long term goals. Patient has improved her balance skills with her Merrilee Jansky Balance score being 50/56 causing patient to be at a decreased fall risk. Patient exhibits improvement in LLE strength as her MMT score increased half a grade in BLE and completing five sit to stands under 15 seconds. Patient is highly motivated to maintain progress and apply her outcomes from therapy to her every day routine. Patient was encouraged to rejoin therapy if patient's quality of life or general health condition  worsens.                     PT Short Term Goals - 05/13/20 1802      PT SHORT TERM GOAL #1   Title Patient will be independent in home exercise program to improve strength/mobility for better functional independence with ADLs.    Baseline 01/18: patient reports of not being consistent with them    Time 4    Period Weeks    Status Achieved    Target Date 04/15/20      PT SHORT TERM GOAL #2   Title Patient will ascend/descend 4 stairs without rail assist independently without loss of balance to improve ability to get in/out of home.    Baseline 11/30: CGA, LOB turning to L, 01/18: independent, no LOB noted    Time 4    Period Weeks    Status Achieved    Target Date 04/15/20             PT Long Term Goals - 05/13/20 1803      PT LONG TERM GOAL #1   Title Patient will increase FOTO score to equal to or greater than 71 to demonstrate statistically significant improvement in mobility and quality of life.    Baseline 11/30: 64,    Time 8    Period Weeks    Status Deferred      PT LONG TERM GOAL #2   Title Patient (< 36 years old) will complete five times sit to stand test in < 10 seconds indicating an increased LE strength and improved balance.    Baseline 11/30:18.35, 01/18: 15.10s, 01/25: 10.8s    Time 8    Period Weeks    Status Partially Met      PT LONG TERM GOAL #3   Title Patient will increase Berg Balance score by > 6 points to demonstrate decreased fall risk during functional activities.    Baseline 11/30: 41/56, 01/18: 50/56    Time 8    Period Weeks    Status Achieved      PT LONG TERM GOAL #4   Title Patient will increase LLE gross strength to 4+/5 as to improve functional strength for independent gait, increased standing tolerance and increased ADL ability    Baseline 11/30: LLE gross strength 3+/5, 01/18: 4/5, 01/25: 4/5    Time 8    Period Weeks    Status Partially Met                 Plan - 05/13/20 1808    Clinical  Impression Statement Patient has reached her maximum rehab potential at this time with physical therapy as evidenced by partially meeting and achieving her short term and long term goals. Patient has improved her balance skills with her Merrilee Jansky Balance score being 50/56 causing patient to be at a decreased fall risk. Patient exhibits  improvement in LLE strength as her MMT score increased half a grade in BLE and completing five sit to stands under 15 seconds. Patient is highly motivated to maintain progress and apply her outcomes from therapy to her every day routine. Patient was encouraged to rejoin therapy if patient's quality of life or general health condition worsens.    Personal Factors and Comorbidities Comorbidity 3+    Comorbidities transverse myelitis, osteoporosis, pernicious anemia, hypoparathyroidism    Examination-Activity Limitations Sit;Stand;Bed Mobility;Stairs    Examination-Participation Restrictions Cleaning;Community Activity    Stability/Clinical Decision Making Evolving/Moderate complexity    Rehab Potential Fair    PT Frequency 2x / week    PT Duration 8 weeks    PT Treatment/Interventions Electrical Stimulation;ADLs/Self Care Home Management;Gait training;Stair training;Functional mobility training;Therapeutic activities;Therapeutic exercise;Balance training;Neuromuscular re-education    PT Next Visit Plan discharged    PT Home Exercise Plan discharged           Patient will benefit from skilled therapeutic intervention in order to improve the following deficits and impairments:  Decreased activity tolerance,Decreased endurance,Decreased strength,Improper body mechanics,Decreased balance,Decreased mobility,Decreased safety awareness,Difficulty walking,Impaired flexibility  Visit Diagnosis: Muscle weakness (generalized)  Unsteadiness on feet     Problem List Patient Active Problem List   Diagnosis Date Noted  . Personal history of colonic polyps   . Polyp of  transverse colon   . Left leg numbness 06/14/2018  . Osteoporosis 06/14/2018   Karl Luke PT, DPT Netta Corrigan 05/13/2020, 6:08 PM  [place] MAIN [facility] SERVICES [address] [place], [place], [address] Phone: [phone]   Fax:  [phone]  Name: Bethany Farmer MRN: [id number] Date of Birth: [birth year]

## 2020-05-14 ENCOUNTER — Ambulatory Visit: Payer: BC Managed Care – PPO

## 2020-05-16 ENCOUNTER — Ambulatory Visit: Payer: BC Managed Care – PPO

## 2020-05-19 ENCOUNTER — Ambulatory Visit: Payer: BC Managed Care – PPO

## 2020-05-20 ENCOUNTER — Ambulatory Visit: Payer: BC Managed Care – PPO

## 2020-05-21 ENCOUNTER — Ambulatory Visit: Payer: BC Managed Care – PPO

## 2020-05-23 ENCOUNTER — Ambulatory Visit: Payer: BC Managed Care – PPO

## 2020-05-27 ENCOUNTER — Ambulatory Visit: Payer: BC Managed Care – PPO

## 2020-05-30 ENCOUNTER — Ambulatory Visit: Payer: BC Managed Care – PPO

## 2020-06-03 ENCOUNTER — Ambulatory Visit: Payer: BC Managed Care – PPO

## 2020-06-05 ENCOUNTER — Ambulatory Visit: Payer: BC Managed Care – PPO

## 2020-06-06 ENCOUNTER — Ambulatory Visit: Payer: BC Managed Care – PPO

## 2020-06-10 ENCOUNTER — Ambulatory Visit: Payer: BC Managed Care – PPO

## 2020-06-13 ENCOUNTER — Ambulatory Visit: Payer: BC Managed Care – PPO

## 2020-10-17 DIAGNOSIS — Z8616 Personal history of COVID-19: Secondary | ICD-10-CM

## 2020-10-17 HISTORY — DX: Personal history of COVID-19: Z86.16

## 2021-02-19 ENCOUNTER — Other Ambulatory Visit: Payer: Self-pay

## 2021-02-19 ENCOUNTER — Encounter: Payer: Self-pay | Admitting: Obstetrics and Gynecology

## 2021-02-19 ENCOUNTER — Ambulatory Visit: Payer: 59 | Admitting: Obstetrics and Gynecology

## 2021-02-19 VITALS — BP 109/66 | HR 77 | Ht 65.0 in | Wt 138.0 lb

## 2021-02-19 DIAGNOSIS — R3911 Hesitancy of micturition: Secondary | ICD-10-CM

## 2021-02-19 DIAGNOSIS — N816 Rectocele: Secondary | ICD-10-CM | POA: Diagnosis not present

## 2021-02-19 DIAGNOSIS — K5904 Chronic idiopathic constipation: Secondary | ICD-10-CM | POA: Diagnosis not present

## 2021-02-19 LAB — POCT URINALYSIS DIPSTICK
Appearance: ABNORMAL
Bilirubin, UA: NEGATIVE
Blood, UA: NEGATIVE
Glucose, UA: NEGATIVE
Ketones, UA: NEGATIVE
Nitrite, UA: NEGATIVE
Protein, UA: POSITIVE — AB
Spec Grav, UA: 1.015 (ref 1.010–1.025)
Urobilinogen, UA: 0.2 E.U./dL
pH, UA: 7.5 (ref 5.0–8.0)

## 2021-02-19 NOTE — Progress Notes (Signed)
[facility] New Patient Evaluation and Consultation  Referring Provider: Risa Grill, MD PCP: Benedetto Coons, MD Date of Service: 02/19/2021  SUBJECTIVE Chief Complaint: New Patient (Initial Visit) Bethany Farmer is a 60 y.o. female here for a consult on a rectocele./)  History of Present Illness: Bethany Farmer is a 60 y.o. White or Caucasian female seen in consultation at the request of Dr. Alto Denver for evaluation of prolapse.    Review of records from Dr Alto Denver significant for: - currently on tamsulosin for urinary hesitancy.  -Has a rectocele and constipation. Has to splint to have a bowel movement. Taking senna daily.   Urinary Symptoms: Does not leak urine.   How many times do you urinate in the daytime? 5  How many times do you wake up at night to urinate? Sometimes 1 but most nights 0  When you urinate, does it feel like you empty your bladder completely? Yes  Do you use a catheter to help empty your bladder? No  Do you notice any of the following when you urinate? (select all that appy) difficulty starting urine stream   need to urinate mutliple times in a row  Number of urinary tract infections in the last year: 1   Denies history of blood in urine and kidney or bladder stones Currently on tamsulosin which helps with the hesitancy. Previously was on oxybutynin which did not help her symptoms.    Pelvic Organ Prolapse Symptoms:                  Do you feel a bulge in the vagina? Yes  How long has a bulge been present? 2 to 3 months  Do you see a bulge coming out of the vagina? No  Is it bothersome? Yes    Bowel Symptom: How often do you have a bowel movement? Usually daily.  Some days none and some twice  What is the consistency of your stools? hard  Do you strain to empty your bowels? Yes  Do you have to push on your rectum or vagina to be able to empty your bowels? Yes  Do you have difficulty completely emptying your rectum during a bowel  movement? Yes  Do you ever leak bowel contents? No   Bowel regimen:  senna  Sexual Function Sexually active: no.   Pelvic Pain Denies pelvic pain   Past Medical History:  Past Medical History:  Diagnosis Date   Arthritis    hands   Transverse myelitis ([place])    questionable in left leg     Past Surgical History:   Past Surgical History:  Procedure Laterality Date   COLONOSCOPY WITH PROPOFOL N/A 09/03/2019   Procedure: COLONOSCOPY WITH PROPOFOL;  Surgeon: Lucilla Lame, MD;  Location: [place];  Service: Endoscopy;  Laterality: N/A;  priority 4   POLYPECTOMY N/A 09/03/2019   Procedure: POLYPECTOMY;  Surgeon: Lucilla Lame, MD;  Location: [place];  Service: Endoscopy;  Laterality: N/A;   TONSILLECTOMY     VAGINAL HYSTERECTOMY  1994   for precancer cells     Past OB/GYN History: OB History  Gravida Para Term Preterm AB Living  1 1 1     1   SAB IAB Ectopic Multiple Live Births          1    # Outcome Date GA Lbr Len/2nd Weight Sex Delivery Anes PTL Lv  1 Term      Vag-Forceps None  LIV   S/p hysterectomy  Medications: She has a current medication list which includes the following prescription(s): calcium carbonate, cholecalciferol, denosumab, duloxetine, gabapentin, krill oil, red yeast rice, tamsulosin, tramadol, and valacyclovir.   Allergies: Patient is allergic to alendronate.   Social History:  Social History   Tobacco Use   Smoking status: Never   Smokeless tobacco: Never  Vaping Use   Vaping Use: Never used  Substance Use Topics   Alcohol use: Not Currently   Drug use: Never    Relationship status: married She lives with husband.   She is not employed. Regular exercise: Yes: walking 3-4 times per week History of abuse: No  Family History:   Family History  Problem Relation Age of Onset   Diabetes Father      Review of Systems: Review of Systems  Constitutional:  Negative for fever, malaise/fatigue and weight loss.   Respiratory:  Negative for cough, shortness of breath and wheezing.   Cardiovascular:  Negative for chest pain, palpitations and leg swelling.  Gastrointestinal:  Negative for abdominal pain and blood in stool.  Genitourinary:  Negative for dysuria.  Musculoskeletal:  Positive for myalgias.  Skin:  Negative for rash.  Neurological:  Negative for dizziness and headaches.  Endo/Heme/Allergies:  Does not bruise/bleed easily.  Psychiatric/Behavioral:  Negative for depression. The patient is not nervous/anxious.     OBJECTIVE Physical Exam: Vitals:   02/19/21 0817  BP: 109/66  Pulse: 77  Weight: 138 lb (62.6 kg)  Height: 5\' 5"  (1.651 m)    Physical Exam Constitutional:      General: She is not in acute distress. Pulmonary:     Effort: Pulmonary effort is normal.  Abdominal:     General: There is no distension.     Palpations: Abdomen is soft.     Tenderness: There is no abdominal tenderness. There is no rebound.  Musculoskeletal:        General: No swelling. Normal range of motion.  Skin:    General: Skin is warm and dry.     Findings: No rash.  Neurological:     Mental Status: She is alert and oriented to person, place, and time.  Psychiatric:        Mood and Affect: Mood normal.        Behavior: Behavior normal.     GU / Detailed Urogynecologic Evaluation:  Pelvic Exam: Normal external female genitalia; Bartholin's and Skene's glands normal in appearance; urethral meatus normal in appearance, no urethral masses or discharge.   CST: negative  s/p hysterectomy: Speculum exam reveals normal vaginal mucosa with  atrophy and normal vaginal cuff.  Adnexa no mass, fullness, tenderness.    Pelvic floor strength II/V, puborectalis IV/V external anal sphincter IV/V  Pelvic floor musculature: Right levator non-tender, Right obturator non-tender, Left levator non-tender, Left obturator non-tender  POP-Q:   POP-Q  -2                                            Aa   -2                                            Ba  -7  C   2                                            Gh  4                                            Pb  8.5                                            tvl   -0.5                                            Ap  -0.5                                            Bp                                                 D     Rectal Exam:  Normal sphincter tone, small distal rectocele, enterocoele not present, no rectal masses  Post-Void Residual (PVR) by Bladder Scan: In order to evaluate bladder emptying, we discussed obtaining a postvoid residual and she agreed to this procedure.  Procedure: The ultrasound unit was placed on the patient's abdomen in the suprapubic region after the patient had voided. A PVR of 10 ml was obtained by bladder scan.  Laboratory Results: POC urine: small leukocytes, neg nitrites  ASSESSMENT AND PLAN Ms. Leer is a 60 y.o. with:  1. Prolapse of posterior vaginal wall   2. Chronic idiopathic constipation   3. Urinary hesitancy    Stage I anterior, Stage II posterior, Stage I apical prolapse - For treatment of pelvic organ prolapse, we discussed options for management including expectant management, conservative management, and surgical management, such as Kegels, a pessary, pelvic floor physical therapy, and specific surgical procedures (posterior repair). - She would like to try a pessary first, will return for a fitting  2. Constipation -  We also discussed the importance of avoiding chronic straining, as it can exacerbate her pelvic floor symptoms; we discussed treating constipation and straining prior to surgery, as postoperative straining can lead to damage to the repair and recurrence of symptoms.  - Has done well overall with senna, continue this regimen  3. Urinary hesitancy - Improved with flomax. Followed by Dr Alto Denver at [facility]  Return for pessary  fitting   Jaquita Folds, MD

## 2021-03-02 ENCOUNTER — Ambulatory Visit: Payer: BC Managed Care – PPO | Admitting: Obstetrics and Gynecology

## 2021-03-16 ENCOUNTER — Encounter: Payer: Self-pay | Admitting: Obstetrics and Gynecology

## 2021-03-24 NOTE — Progress Notes (Deleted)
[facility]   Subjective:     Chief Complaint: No chief complaint on file.  History of Present Illness: Bethany Farmer is a 60 y.o. female with {PFD symptoms:24771} who presents today for a pessary fitting.    Past Medical History: Patient  has a past medical history of Arthritis and Transverse myelitis ([place]).   Past Surgical History: She  has a past surgical history that includes Tonsillectomy; Colonoscopy with propofol (N/A, 09/03/2019); polypectomy (N/A, 09/03/2019); and Vaginal hysterectomy (1994).   Medications: She has a current medication list which includes the following prescription(s): calcium carbonate, cholecalciferol, denosumab, duloxetine, gabapentin, krill oil, red yeast rice, tamsulosin, tramadol, and valacyclovir.   Allergies: Patient is allergic to alendronate.   Social History: Patient  reports that she has never smoked. She has never used smokeless tobacco. She reports that she does not currently use alcohol. She reports that she does not use drugs.      Objective:    There were no vitals taken for this visit. Gen: No apparent distress, A&O x 3. Pelvic Exam: Normal external female genitalia; Bartholin's and Skene's glands normal in appearance; urethral meatus {urethra:24773}, no urethral masses or discharge.   A size *** {pessary type:24772} pessary was fitted. It was comfortable, stayed in place with valsalva and was an appropriate size on examination, with one finger fitting between the pessary and the vaginal walls. We tied a string to it and the patient demonstrated proper removal and replacement.   POP-Q (02/19/21):    POP-Q   -2                                            Aa   -2                                           Ba   -7                                              C    2                                            Gh   4                                            Pb   8.5                                            tvl     -0.5                                            Ap   -0.5  Bp                                                  D      Assessment/Plan:    Assessment: Bethany Farmer is a 60 y.o. with {PFD symptoms:24771} who presents for a pessary fitting. Plan: She was fitted with a *** {pessary type:24772} pessary. She will {pessary plan:24776}. She will {use:24778} {lubricant:24777}.   Follow-up in *** {days/wks/mos/yrs:310907} for a pessary check or sooner as needed.  All questions were answered.    Jaquita Folds, MD

## 2021-03-25 ENCOUNTER — Ambulatory Visit: Payer: 59 | Admitting: Obstetrics and Gynecology

## 2021-04-30 NOTE — Progress Notes (Signed)
[facility] Pre-Operative visit  Subjective Chief Complaint: Bethany Farmer presents for a preoperative encounter.   History of Present Illness: Bethany Farmer is a 61 y.o. female who presents for preoperative visit.  She is scheduled to undergo exam under anesthesia, posterior repair on 05/11/21.  Her symptoms include vaginal bulge and urinary hesitancy, and she was was found to have Stage I anterior, Stage II posterior, Stage I apical prolapse    Past Medical History:  Diagnosis Date   Arthritis    hands   Transverse myelitis ([place])    questionable in left leg     Past Surgical History:  Procedure Laterality Date   COLONOSCOPY WITH PROPOFOL N/A 09/03/2019   Procedure: COLONOSCOPY WITH PROPOFOL;  Surgeon: Lucilla Lame, MD;  Location: [place];  Service: Endoscopy;  Laterality: N/A;  priority 4   POLYPECTOMY N/A 09/03/2019   Procedure: POLYPECTOMY;  Surgeon: Lucilla Lame, MD;  Location: [place];  Service: Endoscopy;  Laterality: N/A;   TONSILLECTOMY     VAGINAL HYSTERECTOMY  1994   for precancer cells    is allergic to alendronate.   Family History  Problem Relation Age of Onset   Diabetes Father     Social History   Tobacco Use   Smoking status: Never   Smokeless tobacco: Never  Vaping Use   Vaping Use: Never used  Substance Use Topics   Alcohol use: Not Currently   Drug use: Never     Review of Systems was negative for a full 10 system review except as noted in the History of Present Illness.   Current Outpatient Medications:    calcium carbonate (OS-CAL) 1250 (500 Ca) MG chewable tablet, Chew 1 tablet by mouth daily., Disp: , Rfl:    cholecalciferol (VITAMIN D3) 25 MCG (1000 UNIT) tablet, Take 1,000 Units by mouth daily., Disp: , Rfl:    denosumab (PROLIA) 60 MG/ML SOSY injection, Inject 60 mg into the skin every 6 (six) months., Disp: , Rfl:    DULoxetine (CYMBALTA) 20 MG capsule, Take 20 mg by mouth 2 (two) times daily.,  Disp: , Rfl:    gabapentin (NEURONTIN) 600 MG tablet, Take 600 mg by mouth 3 (three) times daily., Disp: , Rfl:    Krill Oil 1000 MG CAPS, Take by mouth daily., Disp: , Rfl:    Red Yeast Rice 600 MG CAPS, Take by mouth daily., Disp: , Rfl:    tamsulosin (FLOMAX) 0.4 MG CAPS capsule, Take 0.4 mg by mouth., Disp: , Rfl:    traMADol (ULTRAM) 50 MG tablet, Take 100 mg by mouth 3 (three) times daily., Disp: , Rfl:    valACYclovir (VALTREX) 500 MG tablet, Take 500 mg by mouth 2 (two) times daily., Disp: , Rfl:    Objective Vitals:   05/01/21 1154  BP: 111/63  Pulse: 76    Gen: NAD CV: S1 S2 RRR Lungs: Clear to auscultation bilaterally Abd: soft, nontender   Previous Pelvic Exam showed: POP-Q (02/19/21):    POP-Q   -2                                            Aa   -2  Ba   -7                                              C    2                                            Gh   4                                            Pb   8.5                                            tvl    -0.5                                            Ap   -0.5                                            Bp                                                  D       Assessment/ Plan  Assessment: The patient is a 61 y.o. year old scheduled to undergo Exam under anesthesia, posterior repair. Verbal consent was obtained for these procedures.  Plan: General Surgical Consent: The patient has previously been counseled on alternative treatments, and the decision by the patient and provider was to proceed with the procedure listed above.  For all procedures, there are risks of bleeding, infection, damage to surrounding organs including but not limited to bowel, bladder, blood vessels, ureters and nerves, and need for further surgery if an injury were to occur. These risks are all low with minimally invasive surgery.   There are risks of numbness and weakness at  any body site or buttock/rectal pain.  It is possible that baseline pain can be worsened by surgery, either with or without mesh. If surgery is vaginal, there is also a low risk of possible conversion to laparoscopy or open abdominal incision where indicated. Very rare risks include blood transfusion, blood clot, heart attack, pneumonia, or death.   There is also a risk of short-term postoperative urinary retention with need to use a catheter. About half of patients need to go home from surgery with a catheter, which is then later removed in the office. The risk of long-term need for a catheter is very low. There is also a risk of worsening of overactive bladder.    Prolapse (with or without mesh): Risk factors for surgical failure  include things that put pressure on your pelvis and the surgical repair,  including obesity, chronic cough, and heavy lifting or straining (including lifting children or adults, straining on the toilet, or lifting heavy objects such as furniture or anything weighing >25 lbs. Risks of recurrence is 20-30% with vaginal native tissue repair and a less than 10% with sacrocolpopexy with mesh.    We discussed consent for blood products. Risks for blood transfusion include allergic reactions, other reactions that can affect different body organs and managed accordingly, transmission of infectious diseases such as HIV or Hepatitis. However, the blood is screened. Patient consents for blood products.  Pre-operative instructions:  She was instructed to not take Aspirin/NSAIDs x 7days prior to surgery.  Antibiotic prophylaxis was ordered as indicated.  Catheter use: Patient will go home with foley if needed after post-operative voiding trial.  Post-operative instructions:  She was provided with specific post-operative instructions, including precautions and signs/symptoms for which we would recommend contacting us, in addition to daytime and after-hours contact phone numbers. This was  provided on a handout.   Post-operative medications: Prescriptions for motrin, tylenol, miralax, and oxycodone were sent to her pharmacy. Discussed using ibuprofen and tylenol on a schedule to limit use of narcotics.   Laboratory testing:  No labs needed   Preoperative clearance:  She does not require surgical clearance.    Post-operative follow-up:  A post-operative appointment will be made for 6 weeks from the date of surgery. If she needs a post-operative nurse visit for a voiding trial, that will be set up after she leaves the hospital.    Patient will call the clinic or use MyChart should anything change or any new issues arise.   Jaquita Folds, MD

## 2021-05-01 ENCOUNTER — Other Ambulatory Visit: Payer: Self-pay

## 2021-05-01 ENCOUNTER — Ambulatory Visit (INDEPENDENT_AMBULATORY_CARE_PROVIDER_SITE_OTHER): Payer: 59 | Admitting: Obstetrics and Gynecology

## 2021-05-01 ENCOUNTER — Encounter: Payer: Self-pay | Admitting: Obstetrics and Gynecology

## 2021-05-01 VITALS — BP 111/63 | HR 76

## 2021-05-01 DIAGNOSIS — Z01818 Encounter for other preprocedural examination: Secondary | ICD-10-CM

## 2021-05-01 NOTE — H&P (Signed)
[facility] Pre-Operative H&P  Subjective Chief Complaint: Bethany Farmer presents for a preoperative encounter.   History of Present Illness: Bethany Farmer is a 60 y.o. female who presents for preoperative visit.  She is scheduled to undergo exam under anesthesia, posterior repair on 05/11/21.  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   Family History  Problem Relation Age of Onset   Diabetes Father     Social History   Tobacco Use   Smoking status: Never   Smokeless tobacco: Never  Vaping Use   Vaping Use: Never used  Substance Use Topics   Alcohol use: Not Currently   Drug use: Never     Review of Systems was negative for a full 10 system review except as noted in the History of Present Illness.  No current facility-administered medications for this encounter.  Current Outpatient Medications:    calcium carbonate (OS-CAL) 1250 (500 Ca) MG chewable tablet, Chew 1 tablet by mouth daily., Disp: , Rfl:    cholecalciferol (VITAMIN D3) 25 MCG (1000 UNIT) tablet, Take 1,000 Units by mouth daily., Disp: , Rfl:    denosumab (PROLIA) 60 MG/ML SOSY injection, Inject 60 mg into the skin every 6 (six) months., Disp: , Rfl:    DULoxetine  (CYMBALTA) 20 MG capsule, Take 20 mg by mouth 2 (two) times daily., Disp: , Rfl:    gabapentin (NEURONTIN) 600 MG tablet, Take 600 mg by mouth 3 (three) times daily., Disp: , Rfl:    Krill Oil 1000 MG CAPS, Take by mouth daily., Disp: , Rfl:    Red Yeast Rice 600 MG CAPS, Take by mouth daily., Disp: , Rfl:    tamsulosin (FLOMAX) 0.4 MG CAPS capsule, Take 0.4 mg by mouth., Disp: , Rfl:    traMADol (ULTRAM) 50 MG tablet, Take 100 mg by mouth 3 (three) times daily., Disp: , Rfl:    valACYclovir (VALTREX) 500 MG tablet, Take 500 mg by mouth 2 (two) times daily., Disp: , Rfl:    Objective There were no vitals filed for this visit.   Gen: NAD CV: S1 S2 RRR Lungs: Clear to auscultation bilaterally Abd: soft, nontender   Previous Pelvic Exam showed: POP-Q (02/19/21):    POP-Q   -2                                            Aa   -2  Ba   -7                                              C    2                                            Gh   4                                            Pb   8.5                                            tvl    -0.5                                            Ap   -0.5                                            Bp                                                  D       Assessment/ Plan  The patient is a 61 y.o. year old with stage II posterior vaginal prolapse scheduled to undergo Exam under anesthesia, posterior repair.   Jaquita Folds, MD

## 2021-05-06 ENCOUNTER — Encounter (HOSPITAL_BASED_OUTPATIENT_CLINIC_OR_DEPARTMENT_OTHER): Payer: Self-pay | Admitting: Obstetrics and Gynecology

## 2021-05-06 ENCOUNTER — Other Ambulatory Visit: Payer: Self-pay

## 2021-05-06 NOTE — Progress Notes (Addendum)
Spoke w/ via phone for pre-op interview---pt Lab needs dos---- none               Lab results------neurology lov 04-24-2021 dr Brayton Layman diaz epic COVID test -----patient states asymptomatic no test needed Arrive at -------700 am 05-11-2021 NPO after MN NO Solid Food.  Clear liquids from MN until---600 am Med rec completed Medications to take morning of surgery -----Cymbalta, Gabapentin, Flomax, Tramadol Diabetic medication -----n/a Patient instructed no nail polish to be worn day of surgery Patient instructed to bring photo id and insurance card day of surgery Patient aware to have Driver (ride ) / caregiver    for 24 hours after surgery spouse Bethany Farmer Patient Special Instructions -----none Pre-Op special Istructions -----none Patient verbalized understanding of instructions that were given at this phone interview. Patient denies shortness of breath, chest pain, fever, cough at this phone interview.

## 2021-05-10 NOTE — Anesthesia Preprocedure Evaluation (Addendum)
Anesthesia Evaluation  Patient identified by MRN, date of birth, ID band Patient awake    Reviewed: Allergy & Precautions, NPO status , Patient's Chart, lab work & pertinent test results  History of Anesthesia Complications (+) PONV and history of anesthetic complications  Airway Mallampati: I  TM Distance: >3 FB Neck ROM: Full    Dental  (+) Teeth Intact, Dental Advisory Given   Pulmonary neg pulmonary ROS,    Pulmonary exam normal breath sounds clear to auscultation       Cardiovascular negative cardio ROS Normal cardiovascular exam Rhythm:Regular Rate:Normal     Neuro/Psych  Neuromuscular disease (hx transverse myelitis) negative psych ROS   GI/Hepatic negative GI ROS, Neg liver ROS,   Endo/Other  negative endocrine ROS  Renal/GU negative Renal ROS  Female GU complaint     Musculoskeletal  (+) Arthritis , Osteoarthritis,    Abdominal   Peds  Hematology negative hematology ROS (+)   Anesthesia Other Findings Pain meds; Gabapentin 600mg , tramadol 50mg  TID for chronic upper thoracic pain, cymbalta  Reproductive/Obstetrics negative OB ROS                            Anesthesia Physical Anesthesia Plan  ASA: 2  Anesthesia Plan: General   Post-op Pain Management: Tylenol PO (pre-op), Toradol IV (intra-op), Dilaudid IV, Ketamine IV and Gabapentin PO (pre-op)   Induction: Intravenous  PONV Risk Score and Plan: 4 or greater and Ondansetron, Dexamethasone, Midazolam, Scopolamine patch - Pre-op and Treatment may vary due to age or medical condition  Airway Management Planned:   Additional Equipment:   Intra-op Plan:   Post-operative Plan: Extubation in OR  Informed Consent: I have reviewed the patients History and Physical, chart, labs and discussed the procedure including the risks, benefits and alternatives for the proposed anesthesia with the patient or authorized representative  who has indicated his/her understanding and acceptance.     Dental advisory given  Plan Discussed with: CRNA  Anesthesia Plan Comments:        Anesthesia Quick Evaluation

## 2021-05-11 ENCOUNTER — Other Ambulatory Visit: Payer: Self-pay

## 2021-05-11 ENCOUNTER — Ambulatory Visit (HOSPITAL_BASED_OUTPATIENT_CLINIC_OR_DEPARTMENT_OTHER): Payer: 59 | Admitting: Anesthesiology

## 2021-05-11 ENCOUNTER — Telehealth: Payer: Self-pay | Admitting: Obstetrics and Gynecology

## 2021-05-11 ENCOUNTER — Other Ambulatory Visit: Payer: Self-pay | Admitting: Obstetrics and Gynecology

## 2021-05-11 ENCOUNTER — Ambulatory Visit (HOSPITAL_BASED_OUTPATIENT_CLINIC_OR_DEPARTMENT_OTHER)
Admission: RE | Admit: 2021-05-11 | Discharge: 2021-05-11 | Disposition: A | Discharge status: Home / Self Care | Payer: 59 | Attending: Obstetrics and Gynecology | Admitting: Obstetrics and Gynecology

## 2021-05-11 ENCOUNTER — Encounter (HOSPITAL_BASED_OUTPATIENT_CLINIC_OR_DEPARTMENT_OTHER)
Admission: RE | Disposition: A | Discharge status: Home / Self Care | Payer: Self-pay | Source: Home / Self Care | Attending: Obstetrics and Gynecology

## 2021-05-11 ENCOUNTER — Encounter (HOSPITAL_BASED_OUTPATIENT_CLINIC_OR_DEPARTMENT_OTHER): Payer: Self-pay | Admitting: Obstetrics and Gynecology

## 2021-05-11 DIAGNOSIS — M1990 Unspecified osteoarthritis, unspecified site: Secondary | ICD-10-CM | POA: Diagnosis not present

## 2021-05-11 DIAGNOSIS — N816 Rectocele: Secondary | ICD-10-CM | POA: Diagnosis present

## 2021-05-11 DIAGNOSIS — Z8669 Personal history of other diseases of the nervous system and sense organs: Secondary | ICD-10-CM | POA: Insufficient documentation

## 2021-05-11 DIAGNOSIS — Z01818 Encounter for other preprocedural examination: Secondary | ICD-10-CM

## 2021-05-11 HISTORY — DX: Rectocele: N81.6

## 2021-05-11 HISTORY — DX: Nausea with vomiting, unspecified: R11.2

## 2021-05-11 HISTORY — DX: Other specified postprocedural states: Z98.890

## 2021-05-11 HISTORY — PX: RECTOCELE REPAIR: SHX761

## 2021-05-11 SURGERY — COLPORRHAPHY, POSTERIOR, FOR RECTOCELE REPAIR
Anesthesia: General | Site: Vagina

## 2021-05-11 MED ORDER — OXYCODONE HCL 5 MG PO TABS: 5.0000 mg | ORAL_TABLET | Freq: Once | ORAL | Status: DC | PRN

## 2021-05-11 MED ORDER — POLYETHYLENE GLYCOL 3350 17 GM/SCOOP PO POWD: 17.0000 g | Freq: Every day | ORAL | 0 refills | Status: DC

## 2021-05-11 MED ORDER — ACETAMINOPHEN 500 MG PO TABS
ORAL_TABLET | ORAL | Status: AC
  Filled 2021-05-11: qty 2

## 2021-05-11 MED ORDER — ACETAMINOPHEN 500 MG PO TABS: 500.0000 mg | ORAL_TABLET | Freq: Four times a day (QID) | ORAL | 0 refills | Status: AC | PRN

## 2021-05-11 MED ORDER — FENTANYL CITRATE (PF) 100 MCG/2ML IJ SOLN
INTRAMUSCULAR | Status: DC | PRN
  Administered 2021-05-11: 50 ug via INTRAVENOUS
  Administered 2021-05-11 (×2): 25 ug via INTRAVENOUS

## 2021-05-11 MED ORDER — AMISULPRIDE (ANTIEMETIC) 5 MG/2ML IV SOLN: 10.0000 mg | Freq: Once | INTRAVENOUS | Status: DC | PRN

## 2021-05-11 MED ORDER — PROMETHAZINE HCL 25 MG/ML IJ SOLN: 6.2500 mg | INTRAMUSCULAR | Status: DC | PRN

## 2021-05-11 MED ORDER — PHENYLEPHRINE 40 MCG/ML (10ML) SYRINGE FOR IV PUSH (FOR BLOOD PRESSURE SUPPORT)
PREFILLED_SYRINGE | INTRAVENOUS | Status: AC
  Filled 2021-05-11: qty 10

## 2021-05-11 MED ORDER — ONDANSETRON HCL 4 MG/2ML IJ SOLN
INTRAMUSCULAR | Status: DC | PRN
Start: 2021-05-11 — End: 2021-05-11
  Administered 2021-05-11: 4 mg via INTRAVENOUS

## 2021-05-11 MED ORDER — GABAPENTIN 300 MG PO CAPS
ORAL_CAPSULE | ORAL | Status: AC
  Filled 2021-05-11: qty 1

## 2021-05-11 MED ORDER — HYDROMORPHONE HCL 1 MG/ML IJ SOLN: 0.2500 mg | INTRAMUSCULAR | Status: DC | PRN

## 2021-05-11 MED ORDER — LACTATED RINGERS IV SOLN: INTRAVENOUS | Status: DC

## 2021-05-11 MED ORDER — FENTANYL CITRATE (PF) 100 MCG/2ML IJ SOLN
INTRAMUSCULAR | Status: AC
  Filled 2021-05-11: qty 2

## 2021-05-11 MED ORDER — MIDAZOLAM HCL 2 MG/2ML IJ SOLN
INTRAMUSCULAR | Status: AC
  Filled 2021-05-11: qty 2

## 2021-05-11 MED ORDER — EPHEDRINE SULFATE (PRESSORS) 50 MG/ML IJ SOLN
INTRAMUSCULAR | Status: DC | PRN
  Administered 2021-05-11 (×2): 10 mg via INTRAVENOUS

## 2021-05-11 MED ORDER — KETOROLAC TROMETHAMINE 30 MG/ML IJ SOLN
INTRAMUSCULAR | Status: DC | PRN
  Administered 2021-05-11: 30 mg via INTRAVENOUS

## 2021-05-11 MED ORDER — IBUPROFEN 600 MG PO TABS: 600.0000 mg | ORAL_TABLET | Freq: Four times a day (QID) | ORAL | 0 refills | Status: AC | PRN

## 2021-05-11 MED ORDER — CEFAZOLIN SODIUM-DEXTROSE 2-4 GM/100ML-% IV SOLN
2.0000 g | INTRAVENOUS | Status: AC
  Administered 2021-05-11: 2 g via INTRAVENOUS

## 2021-05-11 MED ORDER — PHENYLEPHRINE HCL (PRESSORS) 10 MG/ML IV SOLN
INTRAVENOUS | Status: DC | PRN
  Administered 2021-05-11 (×3): 80 ug via INTRAVENOUS

## 2021-05-11 MED ORDER — LIDOCAINE 2% (20 MG/ML) 5 ML SYRINGE
INTRAMUSCULAR | Status: DC | PRN
  Administered 2021-05-11: 60 mg via INTRAVENOUS

## 2021-05-11 MED ORDER — MIDAZOLAM HCL 5 MG/5ML IJ SOLN
INTRAMUSCULAR | Status: DC | PRN
  Administered 2021-05-11: 2 mg via INTRAVENOUS

## 2021-05-11 MED ORDER — 0.9 % SODIUM CHLORIDE (POUR BTL) OPTIME
TOPICAL | Status: DC | PRN
  Administered 2021-05-11: 500 mL

## 2021-05-11 MED ORDER — DEXAMETHASONE SODIUM PHOSPHATE 10 MG/ML IJ SOLN
INTRAMUSCULAR | Status: DC | PRN
  Administered 2021-05-11: 10 mg via INTRAVENOUS

## 2021-05-11 MED ORDER — SCOPOLAMINE 1 MG/3DAYS TD PT72
MEDICATED_PATCH | TRANSDERMAL | Status: AC
  Filled 2021-05-11: qty 1

## 2021-05-11 MED ORDER — ACETAMINOPHEN 500 MG PO TABS
1000.0000 mg | ORAL_TABLET | Freq: Once | ORAL | Status: AC
  Administered 2021-05-11: 1000 mg via ORAL

## 2021-05-11 MED ORDER — KETOROLAC TROMETHAMINE 30 MG/ML IJ SOLN: 30.0000 mg | Freq: Once | INTRAMUSCULAR | Status: DC | PRN

## 2021-05-11 MED ORDER — EPHEDRINE 5 MG/ML INJ
INTRAVENOUS | Status: AC
  Filled 2021-05-11: qty 5

## 2021-05-11 MED ORDER — CEFAZOLIN SODIUM-DEXTROSE 2-4 GM/100ML-% IV SOLN
INTRAVENOUS | Status: AC
  Filled 2021-05-11: qty 100

## 2021-05-11 MED ORDER — PROPOFOL 10 MG/ML IV BOLUS
INTRAVENOUS | Status: DC | PRN
Start: 2021-05-11 — End: 2021-05-11
  Administered 2021-05-11: 100 mg via INTRAVENOUS

## 2021-05-11 MED ORDER — OXYCODONE HCL 5 MG/5ML PO SOLN: 5.0000 mg | Freq: Once | ORAL | Status: DC | PRN

## 2021-05-11 MED ORDER — MEPERIDINE HCL 25 MG/ML IJ SOLN: 6.2500 mg | INTRAMUSCULAR | Status: DC | PRN

## 2021-05-11 MED ORDER — SCOPOLAMINE 1 MG/3DAYS TD PT72
1.0000 | MEDICATED_PATCH | TRANSDERMAL | Status: DC
  Administered 2021-05-11: 1.5 mg via TRANSDERMAL

## 2021-05-11 MED ORDER — KETOROLAC TROMETHAMINE 30 MG/ML IJ SOLN
INTRAMUSCULAR | Status: AC
  Filled 2021-05-11: qty 1

## 2021-05-11 MED ORDER — GABAPENTIN 300 MG PO CAPS: 300.0000 mg | ORAL_CAPSULE | Freq: Once | ORAL | Status: DC

## 2021-05-11 MED ORDER — LIDOCAINE-EPINEPHRINE 1 %-1:100000 IJ SOLN
INTRAMUSCULAR | Status: DC | PRN
  Administered 2021-05-11: 4 mL

## 2021-05-11 SURGICAL SUPPLY — 32 items
AGENT HMST KT MTR STRL THRMB (HEMOSTASIS)
BLADE CLIPPER SENSICLIP SURGIC (BLADE) ×1 IMPLANT
BLADE SURG 15 STRL LF DISP TIS (BLADE) ×1 IMPLANT
BLADE SURG 15 STRL SS (BLADE) ×3
DECANTER SPIKE VIAL GLASS SM (MISCELLANEOUS) IMPLANT
DEVICE CAPIO SLIM SINGLE (INSTRUMENTS) IMPLANT
GAUZE 4X4 16PLY ~~LOC~~+RFID DBL (SPONGE) ×5 IMPLANT
GLOVE SURG ENC MOIS LTX SZ6 (GLOVE) ×3 IMPLANT
GLOVE SURG UNDER POLY LF SZ6.5 (GLOVE) ×3 IMPLANT
GOWN STRL REUS W/TWL LRG LVL3 (GOWN DISPOSABLE) ×3 IMPLANT
HIBICLENS CHG 4% 4OZ BTL (MISCELLANEOUS) ×3 IMPLANT
HOLDER FOLEY CATH W/STRAP (MISCELLANEOUS) ×3 IMPLANT
KIT TURNOVER CYSTO (KITS) ×3 IMPLANT
NDL MAYO 6 CRC TAPER PT (NEEDLE) IMPLANT
NEEDLE HYPO 22GX1.5 SAFETY (NEEDLE) ×3 IMPLANT
NEEDLE MAYO 6 CRC TAPER PT (NEEDLE) IMPLANT
NS IRRIG 1000ML POUR BTL (IV SOLUTION) ×1 IMPLANT
NS IRRIG 500ML POUR BTL (IV SOLUTION) ×2 IMPLANT
PACK VAGINAL WOMENS (CUSTOM PROCEDURE TRAY) ×3 IMPLANT
RETRACTOR LONE STAR DISPOSABLE (INSTRUMENTS) ×3 IMPLANT
RETRACTOR STAY HOOK 5MM (MISCELLANEOUS) ×3 IMPLANT
SET IRRIG Y TYPE TUR BLADDER L (SET/KITS/TRAYS/PACK) IMPLANT
SURGIFLO W/THROMBIN 8M KIT (HEMOSTASIS) IMPLANT
SUT ABS MONO DBL WITH NDL 48IN (SUTURE) IMPLANT
SUT VIC AB 0 CT1 27 (SUTURE)
SUT VIC AB 0 CT1 27XBRD ANTBC (SUTURE) IMPLANT
SUT VIC AB 2-0 SH 27 (SUTURE)
SUT VIC AB 2-0 SH 27XBRD (SUTURE) IMPLANT
SUT VICRYL 2-0 SH 8X27 (SUTURE) ×3 IMPLANT
SYR BULB EAR ULCER 3OZ GRN STR (SYRINGE) ×3 IMPLANT
TOWEL OR 17X26 10 PK STRL BLUE (TOWEL DISPOSABLE) ×3 IMPLANT
TRAY FOLEY W/BAG SLVR 14FR LF (SET/KITS/TRAYS/PACK) ×3 IMPLANT

## 2021-05-11 NOTE — Anesthesia Postprocedure Evaluation (Signed)
Anesthesia Post Note  Patient: Bethany Farmer  Procedure(s) Performed: POSTERIOR REPAIR (RECTOCELE) (Vagina )     Patient location during evaluation: PACU Anesthesia Type: General Level of consciousness: awake and alert, oriented and patient cooperative Pain management: pain level controlled Vital Signs Assessment: post-procedure vital signs reviewed and stable Respiratory status: spontaneous breathing, nonlabored ventilation and respiratory function stable Cardiovascular status: blood pressure returned to baseline and stable Postop Assessment: no apparent nausea or vomiting Anesthetic complications: no   No notable events documented.  Last Vitals:  Vitals:   05/11/21 1015 05/11/21 1049  BP: 121/62   Pulse: 91 78  Resp: 13 16  Temp:  (!) 36.3 C  SpO2: 100% 95%    Last Pain:  Vitals:   05/11/21 0728  TempSrc: Oral  PainSc: 0-No pain                 Pervis Hocking

## 2021-05-11 NOTE — Discharge Instructions (Addendum)
POST OPERATIVE INSTRUCTIONS  General Instructions Recovery (not bed rest) will last approximately 6 weeks Walking is encouraged, but refrain from strenuous exercise/ housework/ heavy lifting. No lifting >10lbs  Nothing in the vagina- NO intercourse, tampons or douching Bathing:  Do not submerge in water (NO swimming, bath, hot tub, etc) until after your postop visit. You can shower starting the day after surgery.  No driving until you are not taking narcotic pain medicine and until your pain is well enough controlled that you can slam on the breaks or make sudden movements if needed.   Taking your medications Please take your acetaminophen and ibuprofen on a schedule for the first 48 hours. Take 600mg ibuprofen, then take 500mg acetaminophen 3 hours later, then continue to alternate ibuprofen and acetaminophen. That way you are taking each type of medication every 6 hours. Take the prescribed narcotic (oxycodone, tramadol, etc) as needed, with a maximum being every 4 hours.  Take a stool softener daily to keep your stools soft and preventing you from straining. If you have diarrhea, you decrease your stool softener. This is explained more below. We have prescribed you Miralax.  Reasons to Call the Nurse (see last page for phone numbers) Heavy Bleeding (changing your pad every 1-2 hours) Persistent nausea/vomiting Fever (100.4 degrees or more) Incision problems (pus or other fluid coming out, redness, warmth, increased pain)  Things to Expect After Surgery Mild to Moderate pain is normal during the first day or two after surgery. If prescribed, take Ibuprofen or Tylenol first and use the stronger medicine for "break-through" pain. You can overlap these medicines because they work differently.   Constipation   To Prevent Constipation:  Eat a well-balanced diet including protein, grains, fresh fruit and vegetables.  Drink plenty of fluids. Walk regularly.  Depending on specific instructions  from your physician: take Miralax daily and additionally you can add a stool softener (colace/ docusate) and fiber supplement. Continue as long as you're on pain medications.   To Treat Constipation:  If you do not have a bowel movement in 2 days after surgery, you can take 2 Tbs of Milk of Magnesia 1-2 times a day until you have a bowel movement. If diarrhea occurs, decrease the amount or stop the laxative. If no results with Milk of Magnesia, you can drink a bottle of magnesium citrate which you can purchase over the counter.  Fatigue:  This is a normal response to surgery and will improve with time.  Plan frequent rest periods throughout the day.  Gas Pain:  This is very common but can also be very painful! Drink warm liquids such as herbal teas, bouillon or soup. Walking will help you pass more gas.  Mylicon or Gas-X can be taken over the counter.  Leaking Urine:  Varying amounts of leakage may occur after surgery.  This should improve with time. Your bladder needs at least 3 months to recover from surgery. If you leak after surgery, be sure to mention this to your doctor at your post-op visit. If you were taking medications for overactive bladder prior to surgery, be sure to restart the medications immediately after surgery.  Incisions: If you have incisions on your abdomen, the skin glue will dissolve on its own over time. It is ok to gently rinse with soap and water over these incisions but do not scrub.  Catheter Approximately 50% of patients are unable to urinate after surgery and need to go home with a catheter. This allows your bladder to   rest so it can return to full function. If you go home with a catheter, the office will call to set up a voiding trial a few days after surgery. For most patients, by this visit, they are able to urinate on their own. Long term catheter use is rare.   Return to Work  As work demands and recovery times vary widely, it is hard to predict when you will want  to return to work. If you have a desk job with no strenuous physical activity, and if you would like to return sooner than generally recommended, discuss this with your provider or call our office.   Post op concerns  For non-emergent issues, please call the Urogynecology Nurse. Please leave a message and someone will contact you within one business day.  You can also send a message through MyChart.   AFTER HOURS (After 5:00 PM and on weekends):  For urgent matters that cannot wait until the next business day. Call our office [phone] and connect to the doctor on call.  Please reserve this for important issues.   **FOR ANY TRUE EMERGENCY ISSUES CALL 911 OR GO TO THE NEAREST EMERGENCY ROOM.** Please inform our office or the doctor on call of any emergency.     APPOINTMENTS: Call [phone]  Post Anesthesia Home Care Instructions  Activity: Get plenty of rest for the remainder of the day. A responsible adult should stay with you for 24 hours following the procedure.  For the next 24 hours, DO NOT: -Drive a car -Operate machinery -Drink alcoholic beverages -Take any medication unless instructed by your physician -Make any legal decisions or sign important papers.  Meals: Start with liquid foods such as gelatin or soup. Progress to regular foods as tolerated. Avoid greasy, spicy, heavy foods. If nausea and/or vomiting occur, drink only clear liquids until the nausea and/or vomiting subsides. Call your physician if vomiting continues.  Special Instructions/Symptoms: Your throat may feel dry or sore from the anesthesia or the breathing tube placed in your throat during surgery. If this causes discomfort, gargle with warm salt water. The discomfort should disappear within 24 hours.  If you had a scopolamine patch placed behind your ear for the management of post- operative nausea and/or vomiting:  1. The medication in the patch is effective for 72 hours, after which it should be  removed.  Wrap patch in a tissue and discard in the trash. Wash hands thoroughly with soap and water. 2. You may remove the patch earlier than 72 hours if you experience unpleasant side effects which may include dry mouth, dizziness or visual disturbances. 3. Avoid touching the patch. Wash your hands with soap and water after contact with the patch.   

## 2021-05-11 NOTE — Interval H&P Note (Signed)
History and Physical Interval Note:  05/11/2021 8:31 AM  Bethany Farmer  has presented today for surgery, with the diagnosis of posterior vaginal prolapse.  The various methods of treatment have been discussed with the patient and family. After consideration of risks, benefits and other options for treatment, the patient has consented to  Procedure(s) with comments: POSTERIOR REPAIR (RECTOCELE) (N/A) - total time requested is 45 min as a surgical intervention.  The patient's history has been reviewed, patient examined, no change in status, stable for surgery.  I have reviewed the patient's chart and labs.  Questions were answered to the patient's satisfaction.     Jaquita Folds

## 2021-05-11 NOTE — Transfer of Care (Signed)
Immediate Anesthesia Transfer of Care Note  Patient: Bethany Farmer  Procedure(s) Performed: POSTERIOR REPAIR (RECTOCELE) (Vagina )  Patient Location: PACU  Anesthesia Type:General  Level of Consciousness: drowsy and patient cooperative  Airway & Oxygen Therapy: Patient Spontanous Breathing and Patient connected to face mask oxygen  Post-op Assessment: Report given to RN and Post -op Vital signs reviewed and stable  Post vital signs: Reviewed and stable  Last Vitals:  Vitals Value Taken Time  BP 106/59 05/11/21 0939  Temp    Pulse 90 05/11/21 0942  Resp 12 05/11/21 0942  SpO2 100 % 05/11/21 0942  Vitals shown include unvalidated device data.  Last Pain:  Vitals:   05/11/21 0728  TempSrc: Oral  PainSc: 0-No pain      Patients Stated Pain Goal: 5 (03/35/33 1740)  Complications: No notable events documented.

## 2021-05-11 NOTE — Anesthesia Procedure Notes (Signed)
Procedure Name: LMA Insertion Date/Time: 05/11/2021 8:57 AM Performed by: Genelle Bal, CRNA Pre-anesthesia Checklist: Patient identified, Emergency Drugs available, Suction available and Patient being monitored Patient Re-evaluated:Patient Re-evaluated prior to induction Oxygen Delivery Method: Circle system utilized Preoxygenation: Pre-oxygenation with 100% oxygen Induction Type: IV induction Ventilation: Mask ventilation without difficulty LMA: LMA inserted LMA Size: 4.0 Number of attempts: 1 Airway Equipment and Method: Bite block Placement Confirmation: positive ETCO2 Tube secured with: Tape Dental Injury: Teeth and Oropharynx as per pre-operative assessment

## 2021-05-11 NOTE — Telephone Encounter (Signed)
Bethany Farmer underwent posterior repair on 05/11/21.   She was able to void without difficulty post-operatively.  She was discharged without a catheter. Please call her for a routine post op check. Thanks!  Jaquita Folds, MD

## 2021-05-11 NOTE — Op Note (Signed)
Operative Note  Preoperative Diagnosis: posterior vaginal prolapse  Postoperative Diagnosis: same  Procedures performed:  Posterior repair  Implants: none  Attending Surgeon: Sherlene Shams, MD  Anesthesia: General LMA  Findings: Stage II posterior vaginal prolapse on pelvic exam    Specimens:  ID Type Source Tests Collected by Time Destination  1 :  Tissue PATH Gyn biopsy SURGICAL PATHOLOGY Jaquita Folds, MD 05/11/2021 0809     Estimated blood loss: 10 mL  IV fluids: 600 mL  Urine output: 0 mL  Complications: none  Procedure in Detail: After informed consent was obtained, the patient was taken to the operating room where general anesthesia was induced. She was placed in dorsal lithotomy position, taking care to avoid any traction of the extremities and prepped and draped in the usual sterile fashion. A self-retaining lonestar retractor was placed using four elastic blue stays.  A foley catheter was inserted into the urethra. Attention was then turned to the posterior vagina.  Two Allis clamps were placed in the midline of the posterior vaginal wall defect.  1% lidocaine with epinephrine was injected into the vaginal mucosa. A vertical incision was made between these clamps with a 15 blade scalpel.  The rectovaginal septum was then dissected off the vaginal mucosa bilaterally.  No enterocele was noted.  The rectovaginal septum was then reapproximated with plicating sutures of 2-0 Vicryl.  After placement of the first plication stitch two fingers were inserted into the vaginal to confirm adequate caliber.  The last distal stitch incorporated the perineal body in a U stitch fashion.  After plication, the excess vaginal mucosa was trimmed and the vaginal mucosa was reapproximated using 2-0 Vicryl sutures in a running fashion.  The vagina was copiously irrigated and hemostasis was noted.  Vaginal packing was not placed.  A rectal examination was normal and confirmed no sutures  within the rectum.  The patient tolerated the procedure well.  She was awakened from anesthesia and transferred to the recovery room in stable condition. All counts were correct x 2.     Jaquita Folds, MD

## 2021-05-12 ENCOUNTER — Encounter (HOSPITAL_BASED_OUTPATIENT_CLINIC_OR_DEPARTMENT_OTHER): Payer: Self-pay | Admitting: Obstetrics and Gynecology

## 2021-05-12 ENCOUNTER — Encounter: Payer: Self-pay | Admitting: *Deleted

## 2021-05-12 NOTE — Telephone Encounter (Signed)
Post- Op Call  Minus Liberty underwent posterior repair on 05/11/21 with Dr Wannetta Sender. The patient reports that her pain is controlled. She is taking tylenol and ibuprofen. She reports vaginal bleeding described as small. She has not had a bowel movement and is taking miralax for a bowel regimen. She was discharged without a catheter. Advised to call with any problems or concerns.   Blenda Nicely, RN

## 2021-05-30 ENCOUNTER — Other Ambulatory Visit: Payer: Self-pay | Admitting: Obstetrics and Gynecology

## 2021-05-30 DIAGNOSIS — Z01818 Encounter for other preprocedural examination: Secondary | ICD-10-CM

## 2021-06-24 ENCOUNTER — Encounter: Payer: BC Managed Care – PPO | Admitting: Obstetrics and Gynecology

## 2021-07-03 ENCOUNTER — Encounter: Payer: 59 | Admitting: Obstetrics and Gynecology

## 2021-07-03 NOTE — Progress Notes (Deleted)
[facility] ? ?Date of Visit: 07/03/2021 ? ?History of Present Illness: Ms. Somoza is a 61 y.o. female scheduled today for a post-operative visit.  ?? Surgery: s/p posterior repair on 05/11/21 ?? She did not have a voiding trial post operatively.  ?? Postoperative course has been uncomplicated.  ? ?Today she reports *** ? ?UTI in the last 6 weeks? {YES/NO:21197} ?Pain? {YES/NO:21197} ?She {HAS HAS ZJQ:73419} returned to her normal activity (except for postop restrictions) ?Vaginal bulge? {YES/NO:21197} ?Stress incontinence: {YES/NO:21197} ?Urgency/frequency: {YES/NO:21197} ?Urge incontinence: {YES/NO:21197} ?Voiding dysfunction: {YES/NO:21197} ?Bowel issues: {YES/NO:21197} ? ?Subjective Success: Do you usually have a bulge or something falling out that you can see or feel in the vaginal area? {YES/NO:21197} ?Retreatment Success: Any retreatment with surgery or pessary for any compartment? {YES/NO:21197} ? ?Pathology results: *** ? ?Medications: She has a current medication list which includes the following prescription(s): acetaminophen, calcium carbonate, cholecalciferol, denosumab, duloxetine, gabapentin, ibuprofen, krill oil, polyethylene glycol, polyethylene glycol powder, red yeast rice, senna, tamsulosin, tramadol, and valacyclovir.  ? ?Allergies: Patient is allergic to alendronate.  ? ?Physical Exam: ?There were no vitals taken for this visit.  ?Abdomen: {Exam; abdomen:14935::"soft, non-tender, without masses or organomegaly"} ?***Incisions: {Exam; incision:13523}.  ?Pelvic Examination: Vagina: Incisions {Exam; incision:13523}. Sutures are {DESC; PRESENT/NOT PRESENT:21021351} at incision line and there {ACTION; IS/IS FXT:02409735} granulation tissue. No tenderness along the anterior or posterior vagina. No apical tenderness. No pelvic masses. ***No visible or palpable mesh. ? ?POP-Q: ?No flowsheet data found. ?--------------------------------------------------------- ? ?Assessment and Plan: No  diagnosis found. ? ?- ***Pathology results were reviewed with the patient today and she verbalized understanding that the results were ***benign.  ?- ***The patient was given a copy of her operative note ***and pathology report*** for her records. ?- Can resume regular activity including exercise and intercourse,  if desired.  ?- Discussed avoidance of heavy lifting and straining long term to reduce the risk of recurrance.  ? ?All questions answered.  ? ?No follow-ups on file. ? ? ?

## 2021-07-06 NOTE — Progress Notes (Signed)
Bethany Farmer ? ?Date of Visit: 07/07/2021 ? ?History of Present Illness: Bethany Farmer is a 61 y.o. female scheduled today for a post-operative visit.  ?? Surgery: s/p posterior repair on 05/11/21 ?? She did not have a voiding trial.   ?? Postoperative course has been uncomplicated.  ? ?Today she reports she lifted her granddaughter a few times and she is afraid she messed up the repair. Sometimes feels pelvic pressure.  ? ?UTI in the last 6 weeks? No  ?Pain? No  ?Vaginal bulge? Yes  ?Bowel issues:  Taking miralax once a week, but makes her gassy. Has been straining some with bowel movements, but has improved.  ? ?Subjective Success: Do you usually have a bulge or something falling out that you can see or feel in the vaginal area? Yes  ?Retreatment Success: Any retreatment with surgery or pessary for any compartment? No  ? ? ?Medications: She has a current medication list which includes the following prescription(s): tramadol, acetaminophen, calcium carbonate, cholecalciferol, denosumab, duloxetine, gabapentin, ibuprofen, krill oil, polyethylene glycol powder, red yeast rice, senna, tamsulosin, tramadol, and valacyclovir.  ? ?Allergies: Patient is allergic to alendronate.  ? ?Physical Exam: ?BP 114/67   Pulse 98   ? ?Pelvic Examination: Vagina: Incisions healing well. Sutures are not present. No tenderness along the anterior or posterior vagina. No apical tenderness. No pelvic masses. Moderate spasm of pubrectalis- tender to palpation ? ?POP-Q: ?POP-Q ? ?-2.5  ?                                          Aa   ?-2.5 ?                                          Ba  ?-7.5  ?                                            C  ? ?2.5  ?                                          Gh  ?4  ?                                          Pb  ?8  ?                                          tvl  ? ?-2.5  ?                                          Ap  ?-2.5  ?  Bp  ?   ?                                             D  ? ? ?--------------------------------------------------------- ? ?Assessment and Plan:  ?1. Post-operative state   ? ?- No prolapse recurrence noted today. Has some pelvic floor muscle spasm present which may be contributing to her feeling of pressure. Discussed referral to physical therapy and she would prefer waiting at this time.  ?- Can resume regular activity including exercise and intercourse,  if desired.  ?- Discussed avoidance of heavy lifting and straining long term to reduce the risk of recurrence. She will start a stool softener daily to help avoid straining with bowel movements. ? ?Return 2 months to reexamine ? ?Jaquita Folds, MD ? ? ?

## 2021-07-07 ENCOUNTER — Ambulatory Visit (INDEPENDENT_AMBULATORY_CARE_PROVIDER_SITE_OTHER): Payer: BC Managed Care – PPO | Admitting: Obstetrics and Gynecology

## 2021-07-07 ENCOUNTER — Other Ambulatory Visit: Payer: Self-pay

## 2021-07-07 ENCOUNTER — Encounter: Payer: Self-pay | Admitting: Obstetrics and Gynecology

## 2021-07-07 VITALS — BP 114/67 | HR 98

## 2021-07-07 DIAGNOSIS — Z9889 Other specified postprocedural states: Secondary | ICD-10-CM

## 2021-09-07 NOTE — Progress Notes (Unsigned)
[facility] Return Visit  SUBJECTIVE  History of Present Illness: Bethany Farmer is a 61 y.o. female seen in follow-up. She is s/p posterior repair on 05/11/21. She had a lot of pelvic pressure post op.    Past Medical History: Patient  has a past medical history of Arthritis, History of COVID-19 (10/2020), PONV (postoperative nausea and vomiting), Posterior vaginal wall prolapse, Rectocele, and Transverse myelitis ([place]).   Past Surgical History: She  has a past surgical history that includes Tonsillectomy; Colonoscopy with propofol (N/A, 09/03/2019); polypectomy (N/A, 09/03/2019); Vaginal hysterectomy (1994); and Rectocele repair (N/A, 05/11/2021).   Medications: She has a current medication list which includes the following prescription(s): acetaminophen, calcium carbonate, cholecalciferol, denosumab, duloxetine, gabapentin, ibuprofen, krill oil, polyethylene glycol powder, red yeast rice, senna, tamsulosin, tramadol, tramadol, and valacyclovir.   Allergies: Patient is allergic to alendronate.   Social History: Patient  reports that she has never smoked. She has never used smokeless tobacco. She reports current alcohol use. She reports that she does not use drugs.      OBJECTIVE     Physical Exam: There were no vitals filed for this visit. Gen: No apparent distress, A&O x 3.  Detailed Urogynecologic Evaluation:  Deferred. Prior exam showed:      View : No data to display.             ASSESSMENT AND PLAN    Bethany Farmer is a 61 y.o. with:  No diagnosis found.

## 2021-09-08 ENCOUNTER — Encounter: Payer: Self-pay | Admitting: Obstetrics and Gynecology

## 2021-09-08 ENCOUNTER — Ambulatory Visit (INDEPENDENT_AMBULATORY_CARE_PROVIDER_SITE_OTHER): Payer: BC Managed Care – PPO | Admitting: Obstetrics and Gynecology

## 2021-09-08 VITALS — BP 114/67 | HR 72

## 2021-09-08 DIAGNOSIS — Z01419 Encounter for gynecological examination (general) (routine) without abnormal findings: Secondary | ICD-10-CM

## 2021-09-08 DIAGNOSIS — N816 Rectocele: Secondary | ICD-10-CM

## 2021-12-08 ENCOUNTER — Other Ambulatory Visit: Payer: Self-pay | Admitting: Student

## 2021-12-08 DIAGNOSIS — R29898 Other symptoms and signs involving the musculoskeletal system: Secondary | ICD-10-CM

## 2021-12-22 ENCOUNTER — Ambulatory Visit
Admission: RE | Admit: 2021-12-22 | Discharge: 2021-12-22 | Disposition: A | Discharge status: Home / Self Care | Payer: BC Managed Care – PPO | Source: Ambulatory Visit | Attending: Student | Admitting: Student

## 2021-12-22 DIAGNOSIS — R29898 Other symptoms and signs involving the musculoskeletal system: Secondary | ICD-10-CM

## 2021-12-22 MED ORDER — GADOPICLENOL 0.5 MMOL/ML IV SOLN
6.0000 mL | Freq: Once | INTRAVENOUS | Status: AC | PRN
  Administered 2021-12-22: 6 mL via INTRAVENOUS

## 2022-02-08 ENCOUNTER — Encounter: Payer: Self-pay | Admitting: *Deleted

## 2022-05-13 DIAGNOSIS — M7918 Myalgia, other site: Secondary | ICD-10-CM | POA: Diagnosis not present

## 2022-05-17 DIAGNOSIS — M810 Age-related osteoporosis without current pathological fracture: Secondary | ICD-10-CM | POA: Diagnosis not present

## 2022-05-17 DIAGNOSIS — Z7983 Long term (current) use of bisphosphonates: Secondary | ICD-10-CM | POA: Diagnosis not present

## 2022-05-17 DIAGNOSIS — Z5181 Encounter for therapeutic drug level monitoring: Secondary | ICD-10-CM | POA: Diagnosis not present

## 2022-05-17 DIAGNOSIS — Z8262 Family history of osteoporosis: Secondary | ICD-10-CM | POA: Diagnosis not present

## 2022-05-17 DIAGNOSIS — Z8541 Personal history of malignant neoplasm of cervix uteri: Secondary | ICD-10-CM | POA: Diagnosis not present

## 2022-07-16 DIAGNOSIS — M7918 Myalgia, other site: Secondary | ICD-10-CM | POA: Diagnosis not present
